# Patient Record
Sex: Male | Born: 1990 | Race: White | Hispanic: No | Marital: Single | Smoking: Current every day smoker
Health system: Southern US, Community
[De-identification: ages and names within clinical notes are randomized; demographics above are authoritative.]

## PROBLEM LIST (undated history)

## (undated) DIAGNOSIS — M461 Sacroiliitis, not elsewhere classified: Secondary | ICD-10-CM

## (undated) DIAGNOSIS — M47817 Spondylosis without myelopathy or radiculopathy, lumbosacral region: Secondary | ICD-10-CM

## (undated) DIAGNOSIS — M5136 Other intervertebral disc degeneration, lumbar region: Secondary | ICD-10-CM

## (undated) DIAGNOSIS — G894 Chronic pain syndrome: Secondary | ICD-10-CM

## (undated) HISTORY — DX: Chronic pain syndrome: G89.4

## (undated) HISTORY — DX: Other intervertebral disc degeneration, lumbar region: M51.36

## (undated) HISTORY — DX: Spondylosis without myelopathy or radiculopathy, lumbosacral region: M47.817

## (undated) HISTORY — DX: Sacroiliitis, not elsewhere classified: M46.1

---

## 2016-01-30 DIAGNOSIS — J40 Bronchitis, not specified as acute or chronic: Secondary | ICD-10-CM | POA: Diagnosis not present

## 2016-01-30 DIAGNOSIS — J0100 Acute maxillary sinusitis, unspecified: Secondary | ICD-10-CM | POA: Diagnosis not present

## 2016-01-30 DIAGNOSIS — J029 Acute pharyngitis, unspecified: Secondary | ICD-10-CM | POA: Diagnosis not present

## 2016-01-30 DIAGNOSIS — Z6823 Body mass index (BMI) 23.0-23.9, adult: Secondary | ICD-10-CM | POA: Diagnosis not present

## 2016-05-08 DIAGNOSIS — J069 Acute upper respiratory infection, unspecified: Secondary | ICD-10-CM | POA: Diagnosis not present

## 2016-05-08 DIAGNOSIS — Z6823 Body mass index (BMI) 23.0-23.9, adult: Secondary | ICD-10-CM | POA: Diagnosis not present

## 2016-05-08 DIAGNOSIS — R0981 Nasal congestion: Secondary | ICD-10-CM | POA: Diagnosis not present

## 2016-10-16 DIAGNOSIS — R109 Unspecified abdominal pain: Secondary | ICD-10-CM | POA: Diagnosis not present

## 2016-10-16 DIAGNOSIS — Z6823 Body mass index (BMI) 23.0-23.9, adult: Secondary | ICD-10-CM | POA: Diagnosis not present

## 2016-10-16 DIAGNOSIS — R509 Fever, unspecified: Secondary | ICD-10-CM | POA: Diagnosis not present

## 2016-10-18 DIAGNOSIS — M545 Low back pain: Secondary | ICD-10-CM | POA: Diagnosis not present

## 2016-10-18 DIAGNOSIS — Z87891 Personal history of nicotine dependence: Secondary | ICD-10-CM | POA: Diagnosis not present

## 2016-10-18 DIAGNOSIS — R1031 Right lower quadrant pain: Secondary | ICD-10-CM | POA: Diagnosis not present

## 2016-10-18 DIAGNOSIS — R109 Unspecified abdominal pain: Secondary | ICD-10-CM | POA: Diagnosis not present

## 2017-03-31 ENCOUNTER — Other Ambulatory Visit: Payer: Self-pay

## 2017-03-31 ENCOUNTER — Emergency Department (INDEPENDENT_AMBULATORY_CARE_PROVIDER_SITE_OTHER)
Admission: EM | Admit: 2017-03-31 | Discharge: 2017-03-31 | Disposition: A | Discharge status: Home / Self Care | Payer: BLUE CROSS/BLUE SHIELD | Source: Home / Self Care | Attending: Family Medicine | Admitting: Family Medicine

## 2017-03-31 DIAGNOSIS — M545 Low back pain, unspecified: Secondary | ICD-10-CM

## 2017-03-31 MED ORDER — CYCLOBENZAPRINE HCL 10 MG PO TABS: 10.0000 mg | ORAL_TABLET | Freq: Two times a day (BID) | ORAL | 0 refills | Status: DC | PRN

## 2017-03-31 MED ORDER — KETOROLAC TROMETHAMINE 60 MG/2ML IM SOLN
60.0000 mg | Freq: Once | INTRAMUSCULAR | Status: AC
  Administered 2017-03-31: 60 mg via INTRAMUSCULAR

## 2017-03-31 MED ORDER — MELOXICAM 15 MG PO TABS: 15.0000 mg | ORAL_TABLET | Freq: Every day | ORAL | 0 refills | Status: DC

## 2017-03-31 MED ORDER — TRAMADOL HCL 50 MG PO TABS: 50.0000 mg | ORAL_TABLET | Freq: Four times a day (QID) | ORAL | 0 refills | Status: DC | PRN

## 2017-03-31 MED ORDER — METHYLPREDNISOLONE SODIUM SUCC 40 MG IJ SOLR
80.0000 mg | Freq: Once | INTRAMUSCULAR | Status: AC
  Administered 2017-03-31: 80 mg via INTRAMUSCULAR

## 2017-03-31 MED ORDER — PREDNISONE 20 MG PO TABS: ORAL_TABLET | ORAL | 0 refills | Status: DC

## 2017-03-31 NOTE — ED Triage Notes (Signed)
Pt stated that his back started hurting at work on Friday.  Since then, has had severe back pain on the lower right side.  He states that it has been constant through the weekend, and today.  Takes time to stand, and start moving.  Denies numbness and tingling.  Denies injury.

## 2017-03-31 NOTE — ED Provider Notes (Signed)
[facility]    CSN: [id number] Arrival date & time: 03/31/17  1421     History   Chief Complaint Chief Complaint  Patient presents with  . Back Pain    LOWER RIGHT    HPI Ramond Craverhomas Fischetti is a 26 y.o. male.   HPI  Ramond Craverhomas Estock is a 26 y.o. male presenting to UC with c/o 3 days of gradually worsening Right lower back pain that started while he was at work. He does a lot of bending and lifting at work but does not recall a specific injury.  Pain is aching and sharp at times, 9/10, worse with certain movements.  He has taken ibuprofen and tried OTC Icy Hot w/o relief.  Denies radiation of pain or numbness into his arms or legs. No prior back or neck problems. Denies urinary symptoms. No rashes.    History reviewed. No pertinent past medical history.  There are no active problems to display for this patient.   History reviewed. No pertinent surgical history.     Home Medications    Prior to Admission medications   Medication Sig Start Date End Date Taking? Authorizing Provider  cyclobenzaprine (FLEXERIL) 10 MG tablet Take 1 tablet (10 mg total) 2 (two) times daily as needed by mouth for muscle spasms. 03/31/17   Lurene ShadowPhelps, Seleste Tallman O, PA-C  meloxicam (MOBIC) 15 MG tablet Take 1 tablet (15 mg total) daily by mouth. For at least 5 days, then daily as needed for pain 03/31/17   Lurene ShadowPhelps, Brittanyann Wittner O, PA-C  traMADol (ULTRAM) 50 MG tablet Take 1 tablet (50 mg total) every 6 (six) hours as needed by mouth for moderate pain or severe pain. 03/31/17   Lurene ShadowPhelps, Florencia Zaccaro O, PA-C    Family History History reviewed. No pertinent family history.  Social History Social History   Tobacco Use  . Smoking status: Current Every Day Smoker  . Smokeless tobacco: Current User  Substance Use Topics  . Alcohol use: Yes  . Drug use: No     Allergies   Patient has no known allergies.   Review of Systems Review of Systems  Constitutional: Negative for chills and fever.  Genitourinary:  Negative for dysuria, flank pain, frequency and hematuria.  Musculoskeletal: Positive for back pain and myalgias. Negative for arthralgias, gait problem, joint swelling, neck pain and neck stiffness.  Skin: Negative for color change and rash.  Neurological: Negative for weakness and numbness.     Physical Exam Triage Vital Signs ED Triage Vitals  Enc Vitals Group     BP 03/31/17 1442 139/79     Pulse Rate 03/31/17 1442 68     Resp --      Temp 03/31/17 1442 98.4 F (36.9 C)     Temp Source 03/31/17 1442 Oral     SpO2 03/31/17 1442 98 %     Weight 03/31/17 1443 178 lb (80.7 kg)     Height 03/31/17 1443 6\' 2"  (1.88 m)     Head Circumference --      Peak Flow --      Pain Score 03/31/17 1443 9     Pain Loc --      Pain Edu? --      Excl. in GC? --    No data found.  Updated Vital Signs BP 139/79 (BP Location: Right Arm)   Pulse 68   Temp 98.4 F (36.9 C) (Oral)   Ht 6\' 2"  (1.88 m)   Wt 178 lb (80.7 kg)  SpO2 98%   BMI 22.85 kg/m   Visual Acuity Right Eye Distance:   Left Eye Distance:   Bilateral Distance:    Right Eye Near:   Left Eye Near:    Bilateral Near:     Physical Exam  Constitutional: He is oriented to person, place, and time. He appears well-developed and well-nourished. No distress.  HENT:  Head: Normocephalic and atraumatic.  Mouth/Throat: Oropharynx is clear and moist.  Eyes: EOM are normal.  Neck: Normal range of motion.  Cardiovascular: Normal rate.  Pulmonary/Chest: Effort normal. No respiratory distress.  Musculoskeletal: Normal range of motion. He exhibits tenderness. He exhibits no edema.  Mild tenderness along spin from mid thoracic to lower lumbar spine. No step-offs or crepitus. Tenderness to Right sided thoracic and lumbar muscles.  Increased pain with straight leg raise on the Right. No hip tenderness or crepitus Normal gait.  Neurological: He is alert and oriented to person, place, and time.  Skin: Skin is warm and dry. Capillary  refill takes less than 2 seconds. No rash noted. He is not diaphoretic. No erythema.  Psychiatric: He has a normal mood and affect. His behavior is normal.  Nursing note and vitals reviewed.    UC Treatments / Results  Labs (all labs ordered are listed, but only abnormal results are displayed) Labs Reviewed - No data to display  EKG  EKG Interpretation None       Radiology No results found.  Procedures Procedures (including critical care time)  Medications Ordered in UC Medications  ketorolac (TORADOL) injection 60 mg (60 mg Intramuscular Given 03/31/17 1509)  methylPREDNISolone sodium succinate (SOLU-MEDROL) 40 mg/mL injection 80 mg (80 mg Intramuscular Given 03/31/17 1508)     Initial Impression / Assessment and Plan / UC Course  I have reviewed the triage vital signs and the nursing notes.  Pertinent labs & imaging results that were available during my care of the patient were reviewed by me and considered in my medical decision making (see chart for details).     Pt c/o Right side low back pain after bending and lifting at work 3 days ago. No red flag symptoms Pain likely due to muscle strain Will hold off on plain films at this time as no specific known injury Encouraged light duty and rest Pt given Toradol 60mg  IM and Solumedrol 80mg  IM in UC May try medications prescribed today Encouraged f/u with Sports Medicine in 1 week if not improving.   Final Clinical Impressions(s) / UC Diagnoses   Final diagnoses:  Acute right-sided low back pain without sciatica    ED Discharge Orders        Ordered    traMADol (ULTRAM) 50 MG tablet  Every 6 hours PRN     03/31/17 1459    cyclobenzaprine (FLEXERIL) 10 MG tablet  2 times daily PRN     03/31/17 1459    meloxicam (MOBIC) 15 MG tablet  Daily     03/31/17 1459       Controlled Substance Prescriptions [place] Controlled Substance Registry consulted? Not Applicable   Rolla Platehelps, Robin Pafford O, PA-C 03/31/17 1544

## 2017-03-31 NOTE — Discharge Instructions (Signed)
°  Tramadol is strong pain medication. While taking, do not drink alcohol, drive, or perform any other activities that requires focus while taking these medications.   Meloxicam (Mobic) is an antiinflammatory to help with pain and inflammation.  Do not take ibuprofen, Advil, Aleve, or any other medications that contain NSAIDs while taking meloxicam as this may cause stomach upset or even ulcers if taken in large amounts for an extended period of time.   You were given a shot of solumedrol (a steroid) today to help with inflammation in your lungs to help you breath better and to help with your cough.  You have been prescribed 5 days of prednisone, an oral steroid.  You may start this medication tomorrow with breakfast.

## 2017-04-07 ENCOUNTER — Encounter: Payer: Self-pay | Admitting: Family Medicine

## 2017-04-07 ENCOUNTER — Ambulatory Visit (INDEPENDENT_AMBULATORY_CARE_PROVIDER_SITE_OTHER): Payer: BLUE CROSS/BLUE SHIELD

## 2017-04-07 ENCOUNTER — Ambulatory Visit: Payer: BLUE CROSS/BLUE SHIELD | Admitting: Family Medicine

## 2017-04-07 VITALS — BP 122/49 | HR 71 | Temp 97.5°F | Resp 16 | Ht 74.0 in | Wt 182.0 lb

## 2017-04-07 DIAGNOSIS — M545 Low back pain: Secondary | ICD-10-CM | POA: Diagnosis not present

## 2017-04-07 DIAGNOSIS — S39012A Strain of muscle, fascia and tendon of lower back, initial encounter: Secondary | ICD-10-CM

## 2017-04-07 DIAGNOSIS — R200 Anesthesia of skin: Secondary | ICD-10-CM | POA: Diagnosis not present

## 2017-04-07 MED ORDER — METHOCARBAMOL 500 MG PO TABS: 500.0000 mg | ORAL_TABLET | Freq: Three times a day (TID) | ORAL | 0 refills | Status: DC

## 2017-04-07 NOTE — Patient Instructions (Addendum)
Thank you for coming in today. Get xray today.  Attend PT.  Use a tens unit.  Try methocarbamol for muscle spasm.   Recheck with me in 2 weeks if still hurting.   TENS UNIT: This is helpful for muscle pain and spasm.   Search and Purchase a TENS 7000 2nd edition at  www.tenspros.com or www.Amazon.com It should be less than $30.     TENS unit instructions: Do not shower or bathe with the unit on Turn the unit off before removing electrodes or batteries If the electrodes lose stickiness add a drop of water to the electrodes after they are disconnected from the unit and place on plastic sheet. If you continued to have difficulty, call the TENS unit company to purchase more electrodes. Do not apply lotion on the skin area prior to use. Make sure the skin is clean and dry as this will help prolong the life of the electrodes. After use, always check skin for unusual red areas, rash or other skin difficulties. If there are any skin problems, does not apply electrodes to the same area. Never remove the electrodes from the unit by pulling the wires. Do not use the TENS unit or electrodes other than as directed. Do not change electrode placement without consultating your therapist or physician. Keep 2 fingers with between each electrode. Wear time ratio is 2:1, on to off times.    For example on for 30 minutes off for 15 minutes and then on for 30 minutes off for 15 minutes     Lumbosacral Strain Lumbosacral strain is an injury that causes pain in the lower back (lumbosacral spine). This injury usually occurs from overstretching the muscles or ligaments along your spine. A strain can affect one or more muscles or cord-like tissues that connect bones to other bones (ligaments). What are the causes? This condition may be caused by:  A hard, direct hit (blow) to the back.  Excessive stretching of the lower back muscles. This may result from: ? A fall. ? Lifting something  heavy. ? Repetitive movements such as bending or crouching.  What increases the risk? The following factors may increase your risk of getting this condition:  Participating in sports or activities that involve: ? A sudden twist of the back. ? Pushing or pulling motions.  Being overweight or obese.  Having poor strength and flexibility, especially tight hamstrings or weak muscles in the back or abdomen.  Having too much of a curve in the lower back.  Having a pelvis that is tilted forward.  What are the signs or symptoms? The main symptom of this condition is pain in the lower back, at the site of the strain. Pain may extend (radiate) down one or both legs. How is this diagnosed? This condition is diagnosed based on:  Your symptoms.  Your medical history.  A physical exam. ? Your health care provider may push on certain areas of your back to determine the source of your pain. ? You may be asked to bend forward, backward, and side to side to assess the severity of your pain and your range of motion.  Imaging tests, such as: ? X-rays. ? MRI.  How is this treated? Treatment for this condition may include:  Putting heat and cold on the affected area.  Medicines to help relieve pain and relax your muscles (muscle relaxants).  NSAIDs to help reduce swelling and discomfort.  When your symptoms improve, it is important to gradually return to your normal  routine as soon as possible to reduce pain, avoid stiffness, and avoid loss of muscle strength. Generally, symptoms should improve within 6 weeks of treatment. However, recovery time varies. Follow these instructions at home: Managing pain, stiffness, and swelling   If directed, put ice on the injured area during the first 24 hours after your strain. ? Put ice in a plastic bag. ? Place a towel between your skin and the bag. ? Leave the ice on for 20 minutes, 2-3 times a day.  If directed, put heat on the affected area as  often as told by your health care provider. Use the heat source that your health care provider recommends, such as a moist heat pack or a heating pad. ? Place a towel between your skin and the heat source. ? Leave the heat on for 20-30 minutes. ? Remove the heat if your skin turns bright red. This is especially important if you are unable to feel pain, heat, or cold. You may have a greater risk of getting burned. Activity  Rest and return to your normal activities as told by your health care provider. Ask your health care provider what activities are safe for you.  Avoid activities that take a lot of energy for as long as told by your health care provider. General instructions  Take over-the-counter and prescription medicines only as told by your health care provider.  Donot drive or use heavy machinery while taking prescription pain medicine.  Do not use any products that contain nicotine or tobacco, such as cigarettes and e-cigarettes. If you need help quitting, ask your health care provider.  Keep all follow-up visits as told by your health care provider. This is important. How is this prevented?  Use correct form when playing sports and lifting heavy objects.  Use good posture when sitting and standing.  Maintain a healthy weight.  Sleep on a mattress with medium firmness to support your back.  Be safe and responsible while being active to avoid falls.  Do at least 150 minutes of moderate-intensity exercise each week, such as brisk walking or water aerobics. Try a form of exercise that takes stress off your back, such as swimming or stationary cycling.  Maintain physical fitness, including: ? Strength. ? Flexibility. ? Cardiovascular fitness. ? Endurance. Contact a health care provider if:  Your back pain does not improve after 6 weeks of treatment.  Your symptoms get worse. Get help right away if:  Your back pain is severe.  You cannot stand or walk.  You have  difficulty controlling when you urinate or when you have a bowel movement.  You feel nauseous or you vomit.  Your feet get very cold.  You have numbness, tingling, weakness, or problems using your arms or legs.  You develop any of the following: ? Shortness of breath. ? Dizziness. ? Pain in your legs. ? Weakness in your buttocks or legs. ? Discoloration of the skin on your toes or legs. This information is not intended to replace advice given to you by your health care provider. Make sure you discuss any questions you have with your health care provider. Document Released: 02/13/2005 Document Revised: 11/24/2015 Document Reviewed: 10/08/2015 Elsevier Interactive Patient Education  2017 [employer]

## 2017-04-07 NOTE — Progress Notes (Signed)
Subjective:    I'm seeing this patient as a consultation for:    Scott Greene, Scott Greene, Scott Greene  CC: Back pain  HPI: Scott Greene has acute low back pain without radiation over a week.  The pain is quite severe and is located on the right lower.  The pain is interfering with his ability to work.  He has been out of work now for about a week.  He was originally seen in occupational health but his Worker's Compensation claim was denied.  He was seen in urgent care on November 12 where he was provided with meloxicam prednisone tramadol and Flexeril all of which have not been very helpful.  He denies any bowel bladder dysfunction weakness or numbness.  Denies pain radiating down his leg.  The pain is worse with activity and better with rest.  The pain is quite severe at times.  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  Denies any specific injury.  Past medical history, Surgical history, Family history not pertinant except as noted below, Social history, Allergies, and medications have been entered into the medical record, reviewed, and no changes needed.   Review of Systems: No headache, visual changes, nausea, vomiting, diarrhea, constipation, dizziness, abdominal pain, skin rash, fevers, chills, night sweats, weight loss, swollen lymph nodes, body aches, joint swelling, muscle aches, chest pain, shortness of breath, mood changes, visual or auditory hallucinations.   Objective:    Vitals:   04/07/17 1124  BP: (!) 122/49  Pulse: 71  Resp: 16  Temp: (!) 97.5 F (36.4 C)  SpO2: 100%   General: Well Developed, well nourished, and in no acute distress.  Neuro/Psych: Alert and oriented x3, extra-ocular muscles intact, able to move all 4 extremities, sensation grossly intact. Skin: Warm and dry, no rashes noted.  Respiratory: Not using accessory muscles, speaking in full sentences, trachea midline.  Cardiovascular: Pulses palpable, no extremity edema. Abdomen: Does not appear distended. MSK:    L-spine: Nontender to spinal midline. Patient right lower lumbar paraspinal muscle group and into the right lower sacrum. Range of motion is limited in all directions but is worse with flexion. Lower extremity strength is equal and normal throughout. Reflexes are equal and normal throughout. Sensation is intact throughout. Slump test is negative bilaterally  X-ray L-spine pending: Awaiting formal radiology review.  No results found for this or any previous visit (from the past 24 hour(s)). No results found.  Impression and Recommendations:    Assessment and Plan: [name] with lumbosacral strain quite severe and debilitating currently.  Plan to refer to physical therapy obtain an x-ray of the lumbar spine and continue treatment with NSAIDs.  Will use methocarbamol as a different muscle relaxer in hopes that it worked better than Flexeril.  Will treat primarily with physical therapy however we will also use a heating pad and a TENS unit as well.  Work note provided.  Recheck in 2 weeks.  Return sooner if needed..   Orders Placed This Encounter  Procedures  . DG Lumbar Spine Complete    Standing Status:   Future    Number of Occurrences:   1    Standing Expiration Date:   06/07/2018    Order Specific Question:   Reason for Exam (SYMPTOM  OR DIAGNOSIS REQUIRED)    Answer:   eval lumbosacral pain    Order Specific Question:   [facility]?    [facility]    Order Specific Question:  Radiology Contrast Protocol - do NOT remove file path    Answer:   [email or web address]  . Ambulatory referral to Physical Therapy    Referral Priority:   Routine    Referral Type:   Physical Medicine    Referral Reason:   Specialty Services Required    Requested Specialty:   Physical Therapy   Meds ordered this encounter  Medications  . methocarbamol (ROBAXIN) 500 MG tablet    Sig: Take 1 tablet (500 mg total) 3  (three) times daily by mouth.    Dispense:  90 tablet    Refill:  0    Discussed warning signs or symptoms. Please see discharge instructions. Patient expresses understanding.

## 2017-04-08 ENCOUNTER — Telehealth: Payer: Self-pay | Admitting: Emergency Medicine

## 2017-04-08 NOTE — Telephone Encounter (Signed)
Corrected work note. Will fill out paperwork when ready.

## 2017-04-08 NOTE — Telephone Encounter (Signed)
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 pk

## 2017-04-09 ENCOUNTER — Telehealth: Payer: Self-pay

## 2017-04-09 MED ORDER — HYDROCODONE-ACETAMINOPHEN 5-325 MG PO TABS: 1.0000 | ORAL_TABLET | Freq: Four times a day (QID) | ORAL | 0 refills | Status: DC | PRN

## 2017-04-09 NOTE — Telephone Encounter (Signed)
I spoke with Scott Greene.  I sent in Norco.  Use naproxen instead of meloxicam.  Recheck as scheduled  Continue PT.   Patient researched [facility] Controlled Substance Reporting System.

## 2017-04-09 NOTE — Telephone Encounter (Signed)
Spoke with patient about X-rays, patient states he has completed his pain medication and is still in pain. Advise patient try Tylenol or Ibuprofen for pain. Patient states he feels some type of way when taking the Tramadol and wants to know if a different Rx could be prescribed. Advise patient if something is to be changed it will have to be picked up today or it will have to be Monday morning 04/14/2017

## 2017-04-14 ENCOUNTER — Telehealth: Payer: Self-pay

## 2017-04-14 ENCOUNTER — Encounter: Payer: Self-pay | Admitting: Physical Therapy

## 2017-04-14 ENCOUNTER — Ambulatory Visit: Payer: BLUE CROSS/BLUE SHIELD | Admitting: Physical Therapy

## 2017-04-14 DIAGNOSIS — M25652 Stiffness of left hip, not elsewhere classified: Secondary | ICD-10-CM | POA: Diagnosis not present

## 2017-04-14 DIAGNOSIS — M25651 Stiffness of right hip, not elsewhere classified: Secondary | ICD-10-CM

## 2017-04-14 DIAGNOSIS — M62830 Muscle spasm of back: Secondary | ICD-10-CM | POA: Diagnosis not present

## 2017-04-14 DIAGNOSIS — M545 Low back pain, unspecified: Secondary | ICD-10-CM

## 2017-04-14 MED ORDER — HYDROCODONE-ACETAMINOPHEN 5-325 MG PO TABS: 1.0000 | ORAL_TABLET | Freq: Four times a day (QID) | ORAL | 0 refills | Status: DC | PRN

## 2017-04-14 NOTE — Telephone Encounter (Signed)
Refilled Please inform pt.  Return to clinic for further fills.

## 2017-04-14 NOTE — Telephone Encounter (Signed)
Pt called triage regarding refill. Will route.

## 2017-04-14 NOTE — Patient Instructions (Addendum)
Trunk: Prone Extension (Press-Ups)    Lie on stomach on firm, flat surface. Relax bottom and legs. Raise chest in air with elbows straight. Keep hips flat on surface, sag stomach. Hold __2-3__ seconds. Repeat __5-10__ times. Do __3-4__ sessions per day. CAUTION: Movement should be gentle and slow.   On Elbows (Prone)    Rise up on elbows as high as possible, keeping hips on floor. Hold _20___ seconds. Repeat _1___ times per set. Do __1__ sets per session. Do _3-4___ sessions per day.  Supine: Leg Stretch with Strap (Super Advanced)    Lie on back with one leg straight. Hook strap around other foot. Straighten knee. Raise leg to maximal stretch and straighten knee further by tightening quadriceps. Slowly press other leg down as close to floor as possible. Keep lower abdominals tight. Hold 45-60_ seconds. Warning: Intense stretch. Stay within tolerance. Repeat __2_ times per session. Do _1__ sessions per day.  Hamstring: Wall Stretch    Lie on floor with right leg on wall, other leg through doorway. Scoot buttocks toward wall until stretch is felt in back of thigh. Hold __atleast 45-60_ seconds or longer. Relax. Repeat __1_ times. Do ___ times a day. Repeat with other leg.  Trigger Point Dry Needling  . What is Trigger Point Dry Needling (DN)? o DN is a physical therapy technique used to treat muscle pain and dysfunction. Specifically, DN helps deactivate muscle trigger points (muscle knots).  o A thin filiform needle is used to penetrate the skin and stimulate the underlying trigger point. The goal is for a local twitch response (LTR) to occur and for the trigger point to relax. No medication of any kind is injected during the procedure.   . What Does Trigger Point Dry Needling Feel Like?  o The procedure feels different for each individual patient. Some patients report that they do not actually feel the needle enter the skin and overall the process is not painful. Very mild  bleeding may occur. However, many patients feel a deep cramping in the muscle in which the needle was inserted. This is the local twitch response.   Marland Kitchen. How Will I feel after the treatment? o Soreness is normal, and the onset of soreness may not occur for a few hours. Typically this soreness does not last longer than two days.  o Bruising is uncommon, however; ice can be used to decrease any possible bruising.  o In rare cases feeling tired or nauseous after the treatment is normal. In addition, your symptoms may get worse before they get better, this period will typically not last longer than 24 hours.   . What Can I do After My Treatment? o Increase your hydration by drinking more water for the next 24 hours. o You may place ice or heat on the areas treated that have become sore, however, do not use heat on inflamed or bruised areas. Heat often brings more relief post needling. o You can continue your regular activities, but vigorous activity is not recommended initially after the treatment for 24 hours. o DN is best combined with other physical therapy such as strengthening, stretching, and other therapies.

## 2017-04-14 NOTE — Telephone Encounter (Signed)
Patient called stated that he went to physical therapy and they made him more sore. He is requesting a refill on Hydrocodone. Please advise patient said he is down to his last pill. Teresina Bugaj,CMA

## 2017-04-14 NOTE — Therapy (Signed)
[facility] [address] [place], [place], [address] Phone: [phone]   Fax:  [phone]  Physical Therapy Evaluation  Patient Details  Name: Scott Greene MRN: [id number] Date of Birth: August 13, 1990 Referring Provider: Dr Teressa LowerE Corey   Encounter Date: 04/14/2017  PT End of Session - 04/14/17 0843    Visit Number  1    Number of Visits  8    Date for PT Re-Evaluation  05/12/17    PT Start Time  0843    PT Stop Time  0956    PT Time Calculation (min)  73 min    Activity Tolerance  Patient tolerated treatment well       History reviewed. No pertinent past medical history.  History reviewed. No pertinent surgical history.  There were no vitals filed for this visit.   Subjective Assessment - 04/14/17 0845    Subjective  Pt reports at the  end of his work day he noticed Rt sided back pain about 2-3 weeks. He tried heat/ice and OTC meds through the weekend without relief.  Went to urgent care, had some medications with limited relief.  He has continued pain in the low back Rt side, occassionally into the Rt leg and up toward his thoracic area.  Currently out of work.     How long can you sit comfortably?  ok in a recliner, in straight back chair has pain    How long can you walk comfortably?  increases back pain with distance    Diagnostic tests  x-rays (-)     Patient Stated Goals  get rid of back pain.     Currently in Pain?  Yes    Pain Score  7     Pain Location  Back    Pain Orientation  Right;Lower;Mid    Pain Descriptors / Indicators  Stabbing;Sore    Pain Type  Acute pain    Pain Onset  1 to 4 weeks ago    Pain Frequency  Constant    Aggravating Factors   bending in all directions, prolonged sitting and walking    Pain Relieving Factors  lying flat on back.          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  Has to get into unusual positions.     Leisure  golf , was working out Reliant Energylifting weights and cardio a couple times a week.       Observation/Other Assessments   Focus on Therapeutic Outcomes (FOTO)   60% limited      Posture/Postural Control   Posture/Postural Control  Postural limitations    Postural Limitations  Decreased lumbar lordosis;Decreased thoracic kyphosis limited spinal curves      ROM / Strength   AROM / PROM / Strength  AROM;Strength      AROM   Overall AROM Comments  pain with all lumbar motions.     AROM Assessment Site  Lumbar    Lumbar Flexion  10% present    Lumbar Extension  25% present    Lumbar - Right Side Bend  75% present    Lumbar - Left Side Bend  50% present    Lumbar - Right Rotation  50% present    Lumbar - Left Rotation  75% present      Strength   Strength Assessment Site  Hip;Knee;Ankle;Lumbar    Right/Left Hip  Right Lt wNL    Right Hip Flexion  5/5    Right Hip Extension  5/5    Right Hip ABduction  4+/5 with pain    Right/Left Knee  -- WNL except hamstring 4+/5    Right/Left Ankle  -- WNL    Lumbar Flexion  -- TA good    Lumbar Extension  -- multifidi good.       Flexibility   Soft Tissue Assessment /Muscle Length  yes    Hamstrings  supine SLR Lt 42, Rt 45    Piriformis  tight bilat      Palpation   Spinal mobility  good in sacrum, lumbar and lower thoracic.     Palpation comment  very tight and tender in Rt paraspinals L5-T 8 and into Rt QL      Special Tests    Special Tests  Lumbar    Lumbar Tests  Slump Test;Straight Leg Raise      Slump  test   Findings  Negative    Side  -- bilat      Straight Leg Raise   Findings  Negative    Side   -- bilat             Objective measurements completed on examination: See above findings.      [facility] Adult PT Treatment/Exercise - 04/14/17 0001      Exercises   Exercises  Lumbar      Lumbar Exercises: Stretches   Active Hamstring Stretch  2 reps;60 seconds with strap each side.     Single Knee to Chest Stretch  2 reps;20 seconds each side    Lower Trunk Rotation  -- 10 reps    Quadruped Mid Back Stretch Limitations  attempted cat/cow, pt with no pelvic motion Prone on elbows and prone press ups.       Modalities   Modalities  Electrical Stimulation;Moist Heat      Moist Heat Therapy   Number Minutes Moist Heat  20 Minutes    Moist Heat Location  Lumbar Spine      Electrical Stimulation   Electrical Stimulation Location  Rt low back    Electrical Stimulation Action  IFC    Electrical Stimulation Parameters  to tolerance    Electrical Stimulation Goals  Tone;Pain      Manual Therapy   Manual Therapy  Soft tissue mobilization;Joint mobilization    Joint Mobilization  grade II CPA and Rt UPA lumbar mobs    Soft tissue mobilization  Rt low back - STM to lumbar paraspinals       Trigger Point Dry Needling - 04/14/17 [id number]    Consent Given?  Yes    Education Handout Provided  Yes    Muscles Treated Upper Body  Longissimus    Longissimus Response  Palpable increased muscle length;Twitch response elicited Rt lumbar L4-1  PT Education - 04/14/17 0935    Education provided  Yes    Education Details  HEP and dry needling    Person(s) Educated  Patient    Methods  Explanation;Demonstration;Handout    Comprehension  Returned demonstration;Verbalized understanding          PT Long Term Goals - 04/14/17 0948      PT LONG TERM GOAL #1   Title  I with advanced HEP ( 05/12/17)    Time  4    Period  Weeks    Status  New      PT LONG TERM GOAL #2    Title  report no more than 1/10 Rt sided low back pain after a work shift ( 05/12/17)     Time  4    Period  Weeks    Status  New      PT LONG TERM GOAL #3   Title  demo full and painfree lumbar ROM ( 05/12/17)     Time  4    Period  Weeks    Status  New      PT LONG TERM GOAL #4   Title  improve bilat hamstring flexibility =/> 65 degrees ( 05/12/17)     Time  4    Period  Weeks    Status  New      PT LONG TERM GOAL #5   Title  improve FOTO =/< 34% limited ( 05/12/17)     Time  4    Period  Weeks    Status  New             Plan - 04/14/17 1610    Clinical Impression Statement  Pt presents with about 2 wk h/o Rt sided low back pain.  He has a lot of muscular tightness inthis area. He has decreased spinal curves, very tight hamstrings and minimal to no pelvic motion.  Overall his strength is good.  He is currently out of work.  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     Clinical Presentation  Unstable    Clinical Decision Making  Low    Rehab Potential  Good    PT Frequency  2x / week    PT Duration  4 weeks    PT Treatment/Interventions  Dry needling;Manual techniques;Moist Heat;Ultrasound;Therapeutic activities;Patient/family education;Therapeutic exercise;Cryotherapy;Electrical Stimulation;Passive range of motion    PT Next Visit Plan  manual work rt low back, work on getting pelvic mobility and increasing hamstring flexibility.     Consulted and Agree with Plan of Care  Patient       Patient will benefit from skilled therapeutic intervention in order to improve the following deficits and impairments:  Improper body mechanics, Pain, Postural dysfunction, Increased muscle spasms, Hypomobility, Impaired flexibility  Visit Diagnosis: Acute right-sided low back pain without sciatica - Plan: PT plan of care cert/re-cert  Stiffness of right hip, not elsewhere classified - Plan: PT plan of care cert/re-cert  Stiffness of left  hip, not elsewhere classified - Plan: PT plan of care cert/re-cert  Muscle spasm of back - Plan: PT plan of care cert/re-cert     Problem List There are no active problems to display for this patient.   Roderic Scarce PT  04/14/2017, 9:52 AM  [facility] [address] [place], [place], [address] Phone: [phone]   Fax:  [phone]  Name: Scott Greene MRN: [id number]  Date of Birth: [birth year]

## 2017-04-17 ENCOUNTER — Telehealth: Payer: Self-pay | Admitting: Family Medicine

## 2017-04-17 ENCOUNTER — Encounter: Payer: Self-pay | Admitting: Physical Therapy

## 2017-04-17 ENCOUNTER — Ambulatory Visit: Payer: BLUE CROSS/BLUE SHIELD | Admitting: Physical Therapy

## 2017-04-17 DIAGNOSIS — M25651 Stiffness of right hip, not elsewhere classified: Secondary | ICD-10-CM | POA: Diagnosis not present

## 2017-04-17 DIAGNOSIS — M545 Low back pain, unspecified: Secondary | ICD-10-CM

## 2017-04-17 NOTE — Telephone Encounter (Signed)
Spoke to patient advised him that copy has been faxed. Kathyrn Warmuth,CMA

## 2017-04-17 NOTE — Patient Instructions (Addendum)
Trunk Extension    Standing, place back of open hands on low back. Straighten spine then arch the back and move shoulders back. Pause for 2-5 seconds.  Repeat __2-3__ times per session. Do __several__ sessions per day.  AVOID THE RECLINER!   Abdominal Bracing With Pelvic Floor (Hook-Lying)   With neutral spine, tighten pelvic floor and abdominals. Hold 10 seconds. Repeat __10_ times. Do _several__ times a day. Can be performed in sitting, standing. .  Knee to Chest: Transverse Plane Stability   Bring one knee up, then return. Be sure pelvis does not roll side to side. Keep pelvis still. Lift knee __10_ times each leg. Restabilize pelvis. Repeat with other leg. Do _1-2__ sets.   Hip External Rotation With Pillow: Transverse Plane Stability   KEEP BOTH KNEES BENT.  Slowly roll bent knee out. Be sure pelvis does not rotate. Do _10__ times. Restabilize pelvis. Repeat with other leg. Do _1-2__ sets, _1__ times per day.   [facility] [address] [place], [place] [address]  [phone]) [phone] (fax)    Sleeping on Back  Place pillow under knees. A pillow with cervical support and a roll around waist are also helpful. Copyright  VHI. All rights reserved.  Sleeping on Side Place pillow between knees. Use cervical support under neck and a roll around waist as needed. Copyright  VHI. All rights reserved.   Sleeping on Stomach   If this is the only desirable sleeping position, place pillow under lower legs, and under stomach or chest as needed.  Posture - Sitting   Sit upright, head facing forward. Try using a roll to support lower back. Keep shoulders relaxed, and avoid rounded back. Keep hips level with knees. Avoid crossing legs for long periods. Stand to Sit / Sit to Stand   To sit: Bend knees to lower self onto front edge of chair, then scoot back on seat. To stand: Reverse sequence by placing one  foot forward, and scoot to front of seat. Use rocking motion to stand up.   Work Height and Reach  Ideal work height is no more than 2 to 4 inches below elbow level when standing, and at elbow level when sitting. Reaching should be limited to arm's length, with elbows slightly bent.  Bending  Bend at hips and knees, not back. Keep feet shoulder-width apart.    Posture - Standing   Good posture is important. Avoid slouching and forward head thrust. Maintain curve in low back and align ears over shoul- ders, hips over ankles.  Alternating Positions   Alternate tasks and change positions frequently to reduce fatigue and muscle tension. Take rest breaks. Computer Work   Position work to Art gallery managerface forward. Use proper work and seat height. Keep shoulders back and down, wrists straight, and elbows at right angles. Use chair that provides full back support. Add footrest and lumbar roll as needed.  Getting Into / Out of Car  Lower self onto seat, scoot back, then bring in one leg at a time. Reverse sequence to get out.  Dressing  Lie on back to pull socks or slacks over feet, or sit and bend leg while keeping back straight.    Housework - Sink  Place one foot on ledge of cabinet under sink when standing at sink for prolonged periods.   Pushing / Pulling  Pushing is preferable to pulling. Keep back in proper alignment, and use leg muscles to do the work.  Deep Squat   Squat and lift with both arms held against upper trunk. Tighten stomach muscles without holding breath. Use smooth movements to avoid jerking.  Avoid Twisting   Avoid twisting or bending back. Pivot around using foot movements, and bend at knees if needed when reaching for articles.  Carrying Luggage   Distribute weight evenly on both sides. Use a cart whenever possible. Do not twist trunk. Move body as a unit.   Lifting Principles .Maintain proper posture and head alignment. .Slide object as close as  possible before lifting. .Move obstacles out of the way. .Test before lifting; ask for help if too heavy. .Tighten stomach muscles without holding breath. .Use smooth movements; do not jerk. .Use legs to do the work, and pivot with feet. .Distribute the work load symmetrically and close to the center of trunk. .Push instead of pull whenever possible.   Ask For Help   Ask for help and delegate to others when possible. Coordinate your movements when lifting together, and maintain the low back curve.  Log Roll   Lying on back, bend left knee and place left arm across chest. Roll all in one movement to the right. Reverse to roll to the left. Always move as one unit. Housework - Sweeping  Use long-handled equipment to avoid stooping.   Housework - Wiping  Position yourself as close as possible to reach work surface. Avoid straining your back.  Laundry - Unloading Wash   To unload small items at bottom of washer, lift leg opposite to arm being used to reach.  Gardening - Raking  Move close to area to be raked. Use arm movements to do the work. Keep back straight and avoid twisting.     Cart  When reaching into cart with one arm, lift opposite leg to keep back straight.   Getting Into / Out of Bed  Lower self to lie down on one side by raising legs and lowering head at the same time. Use arms to assist moving without twisting. Bend both knees to roll onto back if desired. To sit up, start from lying on side, and use same move-ments in reverse. Housework - Vacuuming  Hold the vacuum with arm held at side. Step back and forth to move it, keeping head up. Avoid twisting.   Laundry - Armed forces training and education officerLoading Wash  Position laundry basket so that bending and twisting can be avoided.   Laundry - Unloading Dryer  Squat down to reach into clothes dryer or use a reacher.  Gardening - Weeding / Psychiatric nurselanting  Squat or Kneel. Knee pads may be helpful.                   TENS  UNIT  This is helpful for muscle pain and spasm.   Search and Purchase a TENS 7000 2nd edition at www.tenspros.com or www.amazon.com  (It should be less than $30)     TENS unit instructions:   Do not shower or bathe with the unit on  Turn the unit off before removing electrodes or batteries  If the electrodes lose stickiness add a drop of water to the electrodes after they are disconnected from the unit and place on plastic sheet. If you continued to have difficulty, call the TENS unit company to purchase more electrodes.  Do not apply lotion on the skin area prior to use. Make sure the skin is clean and dry as this will help prolong the life of the electrodes.  After use, always check  skin for unusual red areas, rash or other skin difficulties. If there are any skin problems, does not apply electrodes to the same area.  Never remove the electrodes from the unit by pulling the wires.  Do not use the TENS unit or electrodes other than as directed.  Do not change electrode placement without consulting your therapist or physician.  Keep 2 fingers with between each electrode.  [facility] at [facility] [address] [place], [place] [address]  [phone] (office) [phone] (fax) .

## 2017-04-17 NOTE — Telephone Encounter (Signed)
FMLA form filled out and will be faxed today

## 2017-04-17 NOTE — Therapy (Signed)
[facility] [place] [address] [place], [place], [address] Phone: [phone]   Fax:  [phone]  Physical Therapy Treatment  Patient Details  Name: Scott Greene MRN: [id number] Date of Birth: 06-14-1990 Referring Provider: Dr. Denyse Amassorey    Encounter Date: 04/17/2017  PT End of Session - 04/17/17 0934    Visit Number  2    Number of Visits  8    Date for PT Re-Evaluation  05/12/17    PT Start Time  0850    PT Stop Time  0948    PT Time Calculation (min)  58 min    Activity Tolerance  Patient limited by pain    Behavior During Therapy  [facility] for tasks assessed/performed       History reviewed. No pertinent past medical history.  History reviewed. No pertinent surgical history.  There were no vitals filed for this visit.  Subjective Assessment - 04/17/17 0852    Subjective  [name] reports he had a shooting pain down his Right leg the night of last treatment. He is taking muscle relaxer 1-2x/day, pain meds 2x/day.  He hasn't taken anything yet today.      Patient Stated Goals  get rid of back pain.     Currently in Pain?  Yes    Pain Score  8     Pain Location  Back    Pain Orientation  Right    Pain Descriptors / Indicators  Stabbing    Aggravating Factors   bending in all directions, prolonged sitting    Pain Relieving Factors  laying flat on back.          [facility] - 04/17/17 0001      Assessment   Medical Diagnosis  Lumbosacral strain    Referring Provider  Dr. Cherlynn Juneorey     Hand Dominance  Right    Next MD Visit  04/22/17       [facility]/Exercise - 04/17/17 0001      Self-Care   Self-Care  Posture;Heat/Ice Application;Other Self-Care Comments    Posture  pt encouraged to avoid C curve postures, including laying in recliner.  Pt issued posture and body mechanics handout.     Heat/Ice Application  reviewed Ice/heat parameters.     Other Self-Care Comments   pt instructed in sit to/frm  supine via log roll and about how to get in/out of car with back precautions.  Pt verbalized understanding.       Lumbar Exercises: Stretches   Passive Hamstring Stretch  2 reps;30 seconds supine with strap    Prone on Elbows Stretch  -- 8 reps, 15 sec    Press Ups  1 rep;10 seconds increased Rt low back pain    Piriformis Stretch  30 seconds;1 rep;Limitations fig 4 with knee towards opp shoulder    Piriformis Stretch Limitations  increased Rt back pain      Lumbar Exercises: Aerobic   Stationary Bike  unable to tolerate; NuStep L5 slow pace x 4 min       Lumbar Exercises: Supine   Ab Set  10 reps 10 sec     Clam  10 reps with ab set    Bent Knee Raise  10 reps with ab set      Modalities   Modalities  Electrical Stimulation;Cryotherapy      Cryotherapy   Number Minutes Cryotherapy  20 Minutes    Cryotherapy Location  Lumbar Spine  Type of Cryotherapy  Ice pack      Electrical Stimulation   Electrical Stimulation Location  Rt low back and hip    Electrical Stimulation Action  IFC    Electrical Stimulation Parameters  to tolerance     Electrical Stimulation Goals  Tone;Pain             PT Education - 04/17/17 [phone]    Education provided  Yes    Education Details  HEP, TENS info    Person(s) Educated  Patient    Methods  Explanation;Handout;Demonstration;Tactile cues;Verbal cues    Comprehension  Verbalized understanding;Returned demonstration          PT Long Term Goals - 04/14/17 0948      PT LONG TERM GOAL #1   Title  I with advanced HEP ( 05/12/17)    Time  4    Period  Weeks    Status  New      PT LONG TERM GOAL #2   Title  report no more than 1/10 Rt sided low back pain after a work shift ( 05/12/17)     Time  4    Period  Weeks    Status  New      PT LONG TERM GOAL #3   Title  demo full and painfree lumbar ROM ( 05/12/17)     Time  4    Period  Weeks    Status  New      PT LONG TERM GOAL #4   Title  improve bilat hamstring flexibility =/>  65 degrees ( 05/12/17)     Time  4    Period  Weeks    Status  New      PT LONG TERM GOAL #5   Title  improve FOTO =/< 34% limited ( 05/12/17)     Time  4    Period  Weeks    Status  New            Plan - 04/17/17 1753    Clinical Impression Statement  Pt continues with high level of low back pain. He had difficulty tolerating exercises in sitting/standing; slightly improved tolerance in hooklying position.  Pt reported reduction of pain by 2 points with lumbar extension exercises and further reduction of pain with ice/estim.  Pain returned with transitional movement to standing.      Rehab Potential  Good    PT Frequency  2x / week    PT Duration  4 weeks    PT Treatment/Interventions  Dry needling;Manual techniques;Moist Heat;Ultrasound;Therapeutic activities;Patient/family education;Therapeutic exercise;Cryotherapy;Electrical Stimulation;Passive range of motion    PT Next Visit Plan  manual work Rt low back, cont ext based exercise and hamstring flexibility.     Consulted and Agree with Plan of Care  Patient       Patient will benefit from skilled therapeutic intervention in order to improve the following deficits and impairments:  Improper body mechanics, Pain, Postural dysfunction, Increased muscle spasms, Hypomobility, Impaired flexibility  Visit Diagnosis: Acute right-sided low back pain without sciatica  Stiffness of right hip, not elsewhere classified     Problem List There are no active problems to display for this patient.  Mayer CamelJennifer Carlson-Long, PTA 04/17/17 5:56 PM  [facility] [place] [address] [place], [place], [address] Phone: [phone]   Fax:  [phone]  Name: Scott Greene MRN: [id number] Date of Birth: 12-10-1990

## 2017-04-21 ENCOUNTER — Ambulatory Visit (INDEPENDENT_AMBULATORY_CARE_PROVIDER_SITE_OTHER): Payer: BLUE CROSS/BLUE SHIELD | Admitting: Family Medicine

## 2017-04-21 ENCOUNTER — Ambulatory Visit: Payer: BLUE CROSS/BLUE SHIELD | Admitting: Physical Therapy

## 2017-04-21 ENCOUNTER — Encounter: Payer: Self-pay | Admitting: Family Medicine

## 2017-04-21 DIAGNOSIS — M62830 Muscle spasm of back: Secondary | ICD-10-CM | POA: Diagnosis not present

## 2017-04-21 DIAGNOSIS — M545 Low back pain, unspecified: Secondary | ICD-10-CM | POA: Insufficient documentation

## 2017-04-21 DIAGNOSIS — M25651 Stiffness of right hip, not elsewhere classified: Secondary | ICD-10-CM

## 2017-04-21 DIAGNOSIS — M25652 Stiffness of left hip, not elsewhere classified: Secondary | ICD-10-CM

## 2017-04-21 MED ORDER — HYDROCODONE-ACETAMINOPHEN 5-325 MG PO TABS: 1.0000 | ORAL_TABLET | Freq: Four times a day (QID) | ORAL | 0 refills | Status: DC | PRN

## 2017-04-21 NOTE — Patient Instructions (Signed)
Gluteal Sets    Tighten buttocks while pressing pelvis to floor. Hold _5-10___ seconds. Repeat _10___ times per set. Do __1_ sets per session. Do _several __ sessions per day.   [facility] [address] [place], [place] [address]  [phone]) [phone] (fax)

## 2017-04-21 NOTE — Patient Instructions (Addendum)
Thank you for coming in today. Attend PT.  Get MRI in the near future.  Continue to do PT exercises at home.  Continue heating pad and TENS unit.   Recheck with me after MRI or before 05/27/17.

## 2017-04-21 NOTE — Therapy (Signed)
[facility] [address] [place], [place], [address] Phone: [phone]   Fax:  [phone]  Physical Therapy Treatment  Patient Details  Name: Scott Greene Schue MRN: [id number] Date of Birth: [birth year] Referring Provider: Dr. Denyse Amassorey   Encounter Date: 04/21/2017  PT End of Session - 04/21/17 0937    Visit Number  3    Number of Visits  8    Date for PT Re-Evaluation  05/12/17    PT Start Time  0844    PT Stop Time  0945    PT Time Calculation (min)  61 min    Activity Tolerance  Patient limited by pain    Behavior During Therapy  [facility] for tasks assessed/performed       No past medical history on file.  No past surgical history on file.  There were no vitals filed for this visit.  Subjective Assessment - 04/21/17 0848    Subjective  [name] reports he feels about the same as last session.  "From the minute I wake up and throughout the day, the pain is constant".  He has tried to do the exercises daily and avoid recliner, without change.  He is sleeping on/off about 6 hrs / night; interrupted due to pain.     Patient Stated Goals  get rid of back pain.     Currently in Pain?  Yes    Pain Score  8     Pain Location  Back    Pain Orientation  Right    Pain Descriptors / Indicators  Nagging;Dull;Sharp    Aggravating Factors   prolonged walking and sitting, bending in all directions     Pain Relieving Factors  laying flat on back.          [facility] - 04/21/17 0001      Assessment   Medical Diagnosis  Lumbosacral strain    Referring Provider  Dr. Denyse Amassorey    Onset Date/Surgical Date  03/28/17    Hand Dominance  Right    Next MD Visit  04/22/17       [facility] Treatment/Exercise - 04/21/17 0001      Lumbar Exercises: Stretches   Passive Hamstring Stretch  2 reps;30 seconds PTA assist    Single Knee to Chest Stretch  1 rep;10 seconds increased LBP; stopped    Prone on Elbows Stretch  5 reps;30 seconds       Lumbar Exercises: Aerobic   Stationary Bike  NuStep L4: 5 min (slow speed)       Lumbar Exercises: Supine   Glut Set  -- 1 rep     Bridge  -- 4 reps, limited tolerance      Lumbar Exercises: Prone   Straight Leg Raise  -- 2 reps each side. limited tolerance RLE    Other Prone Lumbar Exercises  glute set x 5 sec x 5 reps      Modalities   Modalities  Electrical Stimulation      Moist Heat Therapy   Number Minutes Moist Heat  15 Minutes    Moist Heat Location  Lumbar Spine      Electrical Stimulation   Electrical Stimulation Location  Rt low back and hip    Electrical Stimulation Action  IFC    Electrical Stimulation Parameters  to tolerance     Electrical Stimulation Goals  Pain      Manual Therapy   Manual Therapy  Myofascial release;Soft tissue mobilization  Manual therapy comments  pt prone and in Lt sidelying.     Soft tissue mobilization  STM to Rt lumbar paraspinals, glutes, piriformis.   TPR to Rt QL and Rt glute med. TPR to Rt piriformis with contract relax.     Myofascial Release  MFR to Rt lat, QL, lumbar paraspinals with Rt arm overhead.               PT Education - 04/21/17 0946    Education provided  Yes    Education Details  HEP - add glute set    Person(s) Educated  Patient    Methods  Explanation;Demonstration;Handout;Verbal cues    Comprehension  Returned demonstration;Verbalized understanding          PT Long Term Goals - 04/21/17 0937      PT LONG TERM GOAL #1   Title  I with advanced HEP ( 05/12/17)    Time  4    Period  Weeks    Status  On-going      PT LONG TERM GOAL #2   Title  report no more than 1/10 Rt sided low back pain after a work shift ( 05/12/17)     Period  Weeks    Status  On-going      PT LONG TERM GOAL #3   Title  demo full and painfree lumbar ROM ( 05/12/17)     Time  4    Period  Weeks    Status  On-going      PT LONG TERM GOAL #4   Title  improve bilat hamstring flexibility =/> 65 degrees ( 05/12/17)      Time  4    Period  Weeks    Status  On-going      PT LONG TERM GOAL #5   Title  improve FOTO =/< 34% limited ( 05/12/17)     Time  4    Period  Weeks    Status  On-going            Plan - 04/21/17 3244    Clinical Impression Statement  Pt observed standing and sitting in a posterior pelvic tilt. Pt reported decrease in pain by 2-3 points with prone lumbar ext; encouraged pt to perform freq lumbar ext to decrease pain and assist in return to neutral spine in standing.  Noted decreased firing of Rt glute max muscle with exercise. Despite continued high pain level, pt was able to tolerate NuStep today and is standing more erect with VC vs last treatment.  Pt has had limited progress towards goals; only 3rd visit,    Rehab Potential  Good    PT Frequency  2x / week    PT Duration  4 weeks    PT Treatment/Interventions  Dry needling;Manual techniques;Moist Heat;Ultrasound;Therapeutic activities;Patient/family education;Therapeutic exercise;Cryotherapy;Electrical Stimulation;Passive range of motion    PT Next Visit Plan  manual work Rt low back, cont ext based exercise and hamstring flexibility.     Consulted and Agree with Plan of Care  Patient       Patient will benefit from skilled therapeutic intervention in order to improve the following deficits and impairments:  Improper body mechanics, Pain, Postural dysfunction, Increased muscle spasms, Hypomobility, Impaired flexibility  Visit Diagnosis: Acute right-sided low back pain without sciatica  Stiffness of right hip, not elsewhere classified  Stiffness of left hip, not elsewhere classified  Muscle spasm of back     Problem List There are no active problems to display  for this patient.  Mayer CamelJennifer Carlson-Long, PTA 04/21/17 9:52 AM  [facility] [address] [place], [place], [address] Phone: [phone]   Fax:  [phone]  Name: Scott Greene Goren MRN:  [id number] Date of Birth: 1990/07/11

## 2017-04-21 NOTE — Progress Notes (Signed)
Scott Greene is a 26 y.o. male who [facility] today for follow up back pain.   Scott Greene continues to experience back pain for the last 4 weeks or so.  He denies any radiating pain weakness or numbness or bowel bladder dysfunction. He notes the pain is worse with activity and better with rest. He has attended several sessions of physical therapy but has not yet improved. He uses over-the-counter medications as well as hydrocodone for pain control which do help. Additionally he uses methocarbamol which does help. He is unable to work as the pain is quite severe and interferes with ability to bend push-pull left or climbing.Marland Kitchen.   No past medical history on file. No past surgical history on file. Social History   Tobacco Use  . Smoking status: Current Every Day Smoker  . Smokeless tobacco: Current User  Substance Use Topics  . Alcohol use: Yes     ROS:  As above   Medications: Current Outpatient Medications  Medication Sig Dispense Refill  . HYDROcodone-acetaminophen (NORCO/VICODIN) 5-325 MG tablet Take 1 tablet by mouth every 6 (six) hours as needed. 15 tablet 0  . methocarbamol (ROBAXIN) 500 MG tablet Take 1 tablet (500 mg total) 3 (three) times daily by mouth. 90 tablet 0   No current facility-administered medications for this visit.    No Known Allergies   Exam:  BP 132/82   Pulse 71   Ht 6\' 2"  (1.88 m)   Wt 184 lb (83.5 kg)   BMI 23.62 kg/m  General: Well Developed, well nourished, and in no acute distress.  Neuro/Psych: Alert and oriented x3, extra-ocular muscles intact, able to move all 4 extremities, sensation grossly intact. Skin: Warm and dry, no rashes noted.  Respiratory: Not using accessory muscles, speaking in full sentences, trachea midline.  Cardiovascular: Pulses palpable, no extremity edema. Abdomen: Does not appear distended. MSK: L spine: Nontender to spinal midline. Back motion is limited due to  pain Lower extremity strength is intact throughout Sensation is intact throughout Antalgic gait present   No results found for this or any previous visit (from the past 48 hour(s)). No results found.    Assessment and Plan: 26 y.o. male with lumbago without sciatica failing conservative management unable to work. Plan to continue intermittent Norco for pain control and continue physical therapy. We'll arrange for MRI as patient has experienced pain now for almost 6 weeks. Previous x-ray was unremarkable.Recheck in 2-4 weeks.    Orders Placed This Encounter  Procedures  . MR Lumbar Spine Wo Contrast    Standing Status:   Future    Standing Expiration Date:   06/22/2018    Order Specific Question:   What is the patient's sedation requirement?    Answer:   No Sedation    Order Specific Question:   Does the patient have a pacemaker or implanted devices?    Answer:   No    Order Specific Question:   [facility]erred imaging [facility]?    [facility]    Order Specific Question:   Radiology Contrast Protocol - do NOT remove file path    Answer:   [email or web address]   Meds ordered this encounter  Medications  . HYDROcodone-acetaminophen (NORCO/VICODIN) 5-325 MG tablet    Sig: Take 1 tablet by mouth every 6 (six) hours as needed.    Dispense:  15 tablet    Refill:  0    Discussed warning signs or  symptoms. Please see discharge instructions. Patient expresses understanding.  I spent 25 minutes with this patient, greater than 50% was face-to-face time counseling regarding ddx and treatment plan.  Patient researched [place] Controlled Substance Reporting System.

## 2017-04-24 ENCOUNTER — Ambulatory Visit (INDEPENDENT_AMBULATORY_CARE_PROVIDER_SITE_OTHER): Payer: BLUE CROSS/BLUE SHIELD | Admitting: Physical Therapy

## 2017-04-24 ENCOUNTER — Encounter: Payer: Self-pay | Admitting: Physical Therapy

## 2017-04-24 ENCOUNTER — Telehealth: Payer: Self-pay

## 2017-04-24 DIAGNOSIS — M62830 Muscle spasm of back: Secondary | ICD-10-CM

## 2017-04-24 DIAGNOSIS — M545 Low back pain, unspecified: Secondary | ICD-10-CM

## 2017-04-24 DIAGNOSIS — M25651 Stiffness of right hip, not elsewhere classified: Secondary | ICD-10-CM

## 2017-04-24 DIAGNOSIS — M25652 Stiffness of left hip, not elsewhere classified: Secondary | ICD-10-CM | POA: Diagnosis not present

## 2017-04-24 NOTE — Telephone Encounter (Signed)
Scott JacobsonHelen from Imaging called stated thatPatient requested a early refill on his Hydrocodone for tomorrow due to there may be snow on Monday and he may run out. Patient stated that he gets this medication weekly. .please advise patient has MRI scheduled for 12/17, according to helen.[name],CMA

## 2017-04-24 NOTE — Therapy (Signed)
[facility] [address] [place], [place], [address] Phone: [phone]   Fax:  [phone]  Physical Therapy Treatment  Patient Details  Name: Scott Greene MRN: [id number] Date of Birth: 1990/08/19 Referring Provider: Dr. Denyse Amassorey   Encounter Date: 04/24/2017  PT End of Session - 04/24/17 0847    Visit Number  4    Number of Visits  8    Date for PT Re-Evaluation  05/12/17    PT Start Time  0844    PT Stop Time  0932    PT Time Calculation (min)  48 min    Activity Tolerance  Patient tolerated treatment well;Patient limited by pain    Behavior During Therapy  [facility] for tasks assessed/performed       History reviewed. No pertinent past medical history.  History reviewed. No pertinent surgical history.  There were no vitals filed for this visit.  Subjective Assessment - 04/24/17 0848    Subjective  Pt reports his pain is still constant, but the pain level fluctuates.  He has been doing exercises daily.  He got his TENS working and has used it once since last session.     Currently in Pain?  Yes    Pain Score  6     Pain Location  Back    Pain Orientation  Right    Pain Descriptors / Indicators  Nagging    Aggravating Factors   prolonged walking, sitting, bending    Pain Relieving Factors  laying flat on back.          [facility] PT Assessment - 04/24/17 0001      Assessment   Medical Diagnosis  Lumbosacral strain    Referring Provider  Dr. Denyse Amassorey    Onset Date/Surgical Date  03/28/17    Hand Dominance  Right    Next MD Visit  to schedule after MRI      Flexibility   Hamstrings  supine SLR Lt 35, Rt 40                  [facility] Adult PT Treatment/Exercise - 04/24/17 0001      Lumbar Exercises: Stretches   Passive Hamstring Stretch  30 seconds;3 reps    Hip Flexor Stretch  2 reps;30 seconds standing with straight leg extended    Prone on Elbows Stretch  60 seconds;4 reps performed throughout treatment     Piriformis Stretch  Limitations;2 reps fig 4 with knee towards opp shoulder; 45 sec reps      Lumbar Exercises: Aerobic   Stationary Bike  NuStep L3-5: 4.5 min  slow speed.       Lumbar Exercises: Prone   Straight Leg Raise  5 reps 2 sets    Opposite Arm/Leg Raise  Right arm/Left leg;Left arm/Right leg;5 reps 2 sets with rest break in between    Other Prone Lumbar Exercises  glute set x 5 sec x 5 reps weak firing on Rt side      Lumbar Exercises: Quadruped   Other Quadruped Lumbar Exercises  attempt of childs pose x 2, unable to make it to quadruped position due to increased Rt LBP.       Modalities   Modalities  Electrical Stimulation;Ultrasound      Moist Heat Therapy   Number Minutes Moist Heat  --      Electrical Stimulation   Electrical Stimulation Location  Rt lumbar paraspinals/ QL/glute med    Engineer, manufacturinglectrical Stimulation Action  combo [place]  Electrical Stimulation Parameters  to tolerance     Electrical Stimulation Goals  Pain;Tone      Ultrasound   Ultrasound Location  Rt lumbar paraspinals/QL/glute med    Ultrasound Parameters  combo US: 100%, 1.3w/cm2, 10 min     Ultrasound Goals  Pain;Other (Comment) tightness                  PT Long Term Goals - 04/21/17 0937      PT LONG TERM GOAL #1   Title  I with advanced HEP ( 05/12/17)    Time  4    Period  Weeks    Status  On-going      PT LONG TERM GOAL #2   Title  report no more than 1/10 Rt sided low back pain after a work shift ( 05/12/17)     Period  Weeks    Status  On-going      PT LONG TERM GOAL #3   Title  demo full and painfree lumbar ROM ( 05/12/17)     Time  4    Period  Weeks    Status  On-going      PT LONG TERM GOAL #4   Title  improve bilat hamstring flexibility =/> 65 degrees ( 05/12/17)     Time  4    Period  Weeks    Status  On-going      PT LONG TERM GOAL #5   Title  improve FOTO =/< 34% limited ( 05/12/17)     Time  4    Period  Weeks    Status  On-going             Plan - 04/24/17 0945    Clinical Impression Statement  Pt continues with very tight hamstrings (35-40 deg SLR with hamstring stretch). Pt able to tolerate increased ext based exercises without increase in pain. Pt reported reduction of pain with combo US.  Pt making gradual progress towards established goals.     Rehab Potential  Good    PT Frequency  2x / week    PT Duration  4 weeks    PT Treatment/Interventions  Dry needling;Manual techniques;Moist Heat;Ultrasound;Therapeutic activities;Patient/family education;Therapeutic exercise;Cryotherapy;Electrical Stimulation;Passive range of motion    PT Next Visit Plan  manual work / DN , cont ext based exercise and hamstring flexibility. Add prone opp arm/leg ext to HEP if tolerated.     Consulted and Agree with Plan of Care  Patient       Patient will benefit from skilled therapeutic intervention in order to improve the following deficits and impairments:  Improper body mechanics, Pain, Postural dysfunction, Increased muscle spasms, Hypomobility, Impaired flexibility  Visit Diagnosis: Acute right-sided low back pain without sciatica  Muscle spasm of back  Stiffness of right hip, not elsewhere classified  Stiffness of left hip, not elsewhere classified     Problem List Patient Active Problem List   Diagnosis Date Noted  . Lumbago 04/21/2017   Mayer CamelJennifer Carlson-Long, PTA 04/24/17 9:57 AM  [facility] [address] [place], [place], [address] Phone: [phone]   Fax:  [phone]  Name: Scott Greene MRN: [id number] Date of Birth: [birth year]

## 2017-04-24 NOTE — Telephone Encounter (Signed)
That is way too fast to run out of pain medicine. We cannot refill this medicine until after the MRI.

## 2017-04-24 NOTE — Telephone Encounter (Signed)
Spoke to patient gave him advise as noted below. Rhonda Cunningham,CMA  

## 2017-04-26 DIAGNOSIS — M47896 Other spondylosis, lumbar region: Secondary | ICD-10-CM | POA: Diagnosis not present

## 2017-04-26 DIAGNOSIS — M5127 Other intervertebral disc displacement, lumbosacral region: Secondary | ICD-10-CM | POA: Diagnosis not present

## 2017-04-26 DIAGNOSIS — M47897 Other spondylosis, lumbosacral region: Secondary | ICD-10-CM | POA: Diagnosis not present

## 2017-04-26 DIAGNOSIS — M5126 Other intervertebral disc displacement, lumbar region: Secondary | ICD-10-CM | POA: Diagnosis not present

## 2017-04-29 ENCOUNTER — Telehealth: Payer: Self-pay | Admitting: Family Medicine

## 2017-04-29 ENCOUNTER — Encounter: Payer: Self-pay | Admitting: Family Medicine

## 2017-04-29 ENCOUNTER — Ambulatory Visit (INDEPENDENT_AMBULATORY_CARE_PROVIDER_SITE_OTHER): Payer: BLUE CROSS/BLUE SHIELD | Admitting: Family Medicine

## 2017-04-29 ENCOUNTER — Encounter: Payer: BLUE CROSS/BLUE SHIELD | Admitting: Physical Therapy

## 2017-04-29 VITALS — BP 136/84 | HR 86 | Ht 74.0 in | Wt 186.0 lb

## 2017-04-29 DIAGNOSIS — M545 Low back pain, unspecified: Secondary | ICD-10-CM

## 2017-04-29 MED ORDER — HYDROCODONE-ACETAMINOPHEN 5-325 MG PO TABS: 1.0000 | ORAL_TABLET | Freq: Every evening | ORAL | 0 refills | Status: DC | PRN

## 2017-04-29 NOTE — Telephone Encounter (Signed)
Pt advised and scheduled.

## 2017-04-29 NOTE — Progress Notes (Signed)
Scott Greene is a 26 y.o. male who [facility] for [facility].    [name] continues to experience predominately right lower back pain.   The pain is continuous but is worse with activity. He denies any pain radiating down the leg and no weakness or bowel or bladder dysfunction.  He has tried OTC medicines which do not work well. He has used hydrocodone which helps some. He's had a few sessions of physical therapy but is not yet better. He is unable to work because the pain is quite bad.  No past medical history on file. No past surgical history on file. Social History   Tobacco Use  . Smoking status: Current Every Day Smoker  . Smokeless tobacco: Current User  Substance Use Topics  . Alcohol use: Yes     ROS:  As above   Medications: Current Outpatient Medications  Medication Sig Dispense Refill  . HYDROcodone-acetaminophen (NORCO/VICODIN) 5-325 MG tablet Take 1 tablet by mouth at bedtime as needed. 15 tablet 0  . methocarbamol (ROBAXIN) 500 MG tablet Take 1 tablet (500 mg total) 3 (three) times daily by mouth. 90 tablet 0   No current facility-administered medications for this visit.    No Known Allergies   Exam:  BP 136/84   Pulse 86   Ht 6\' 2"  (1.88 m)   Wt 186 lb (84.4 kg)   BMI 23.88 kg/m  General: Well Developed, well nourished, and in no acute distress.  Neuro/Psych: Alert and oriented x3, extra-ocular muscles intact, able to move all 4 extremities, sensation grossly intact. Skin: Warm and dry, no rashes noted.  Respiratory: Not using accessory muscles, speaking in full sentences, trachea midline.  Cardiovascular: Pulses palpable, no extremity edema. Abdomen: Does not appear distended. MSK:  L spine: Nontender to midline. Tender palpation right lumbar paraspinal muscle group. Decreased motion due to pain. Lower extremity strength is intact. Mild antalgic gait.  MRI Spine Lumbar WO IV  Contrast12/02/2017 [facility] Result Impression  IMPRESSION:  1. Small right distal foraminal/far lateral disc protrusion at L4-L5 which abuts the exiting right L4 nerve. Correlate with radicular symptoms.  2. Small central disc protrusion at L5-S1 which abuts but does not significantly displace the descending S1 nerve roots. No significant stenosis demonstrated.  Electronically Signed by: Wonda ChengMichael Reardon  Result Narrative  MRI lumbar spine:  INDICATION: Right-sided low back pain  TECHNIQUE: Sagittal and axial T1 and T2-weighted sequences were performed. Additional sagittal STIR images were performed.  COMPARISON: CT abdomen pelvis from 10/18/2016.  FINDINGS:  Last well-formed disc space designated L5-S1 for the purposes of this report.  #Vertebral bodies: No compression fracture. #Alignment: No malalignment. Straightening of the normal lumbar lordosis. #Marrow signal: Chronic appearing degenerative endplate change at L5-S1. No significant bony edema. #Conus medullaris: Normal. Terminates at L1 with no acute abnormality.  #Lower thoracic segments: No significant abnormality. #No acute paraspinal soft tissue abnormality.  #L1-2: No significant stenosis #L2-3: No significant stenosis #L3-4: No significant stenosis #L4-5: Small disc bulge. Mild facet arthropathy. Small superimposed right distal foraminal/far lateral disc protrusion which abuts the exiting right L4 nerve. No significant stenosis. #L5-S1: Mild facet arthropathy. Small central disc protrusion which abuts but does not significantly displace the descending S1 nerve roots. No significant stenosis.        Assessment and Plan: 26 y.o. male with  Acute back pain. Very likely myofascial disruption and dysfunction and spasm.  This should improve with PT. 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 Will arrange for right L4-L5 and L5-S1 facet injections.  Will also refill NORCO. We  discussed the risk of addiction and the importance of using this medicine sparingly.   Patient researched [facility] [place] Controlled Sub[facility] Reporting System.    Orders Placed This Encounter  Procedures  . DG FACET JT INJ L /S  2ND LEVEL LEFT W/FL/CT    Standing Status:   Future    Standing Expiration Date:   06/30/2018    Order Specific Question:   Reason for Exam (SYMPTOM  OR DIAGNOSIS REQUIRED)    Answer:   RT L4/L5    Order Specific Question:   Preferred imaging location?    Answer:   [facility]  . DG FACET JT INJ L /S SINGLE LEVEL RIGHT W/FL/CT    Order Specific Question:   Reason for exam:    Answer:   Right L5/S1    Order Specific Question:   Preferred imaging location?    Answer:   [facility]   Meds ordered this encounter  Medications  . HYDROcodone-acetaminophen (NORCO/VICODIN) 5-325 MG tablet    Sig: Take 1 tablet by mouth at bedtime as needed.    Dispense:  15 tablet    Refill:  0    Discussed warning signs or symptoms. Please see discharge instructions. Patient expresses understanding.  I spent 25 minutes with this patient, greater than 50% was face-to-face time counseling regarding ddx and treatment plan.

## 2017-04-29 NOTE — Telephone Encounter (Signed)
[facility] Would like refill on pain meds. Will route.

## 2017-04-29 NOTE — Patient Instructions (Addendum)
Thank you for coming in today. Continue PT.  You should hear about back injections in the next few days.  If you dont hear anything call my office and let me know.  Return sooner if needed.  Let check back in 2-3 weeks.  When you return try to bring the MRI CD. [facility] can make you one.

## 2017-04-29 NOTE — Telephone Encounter (Signed)
Please schedule a follow up visit. Get an image CD made form [facility] and bring it to the next visit.  I have appointment available today.

## 2017-04-30 ENCOUNTER — Encounter: Payer: BLUE CROSS/BLUE SHIELD | Admitting: Physical Therapy

## 2017-05-01 ENCOUNTER — Ambulatory Visit (INDEPENDENT_AMBULATORY_CARE_PROVIDER_SITE_OTHER): Payer: BLUE CROSS/BLUE SHIELD | Admitting: Physical Therapy

## 2017-05-01 ENCOUNTER — Encounter: Payer: Self-pay | Admitting: Physical Therapy

## 2017-05-01 DIAGNOSIS — M25652 Stiffness of left hip, not elsewhere classified: Secondary | ICD-10-CM

## 2017-05-01 DIAGNOSIS — M25651 Stiffness of right hip, not elsewhere classified: Secondary | ICD-10-CM

## 2017-05-01 DIAGNOSIS — M545 Low back pain, unspecified: Secondary | ICD-10-CM

## 2017-05-01 DIAGNOSIS — M62830 Muscle spasm of back: Secondary | ICD-10-CM | POA: Diagnosis not present

## 2017-05-01 NOTE — Therapy (Signed)
[facility] [address] [place], [place], [address] Phone: [phone]   Fax:  [phone]  Physical Therapy Treatment  Patient Details  Name: Scott Greene MRN: [id number] Date of Birth: 08-19-1990 Referring Provider: Dr. Denyse Amassorey   Encounter Date: 05/01/2017  PT End of Session - 05/01/17 1012    Visit Number  5    Number of Visits  8    Date for PT Re-Evaluation  05/12/17    PT Start Time  1012    PT Stop Time  1057    PT Time Calculation (min)  45 min    Activity Tolerance  Patient limited by pain       History reviewed. No pertinent past medical history.  History reviewed. No pertinent surgical history.  There were no vitals filed for this visit.  Subjective Assessment - 05/01/17 1015    Subjective  Pt reports he is still having the pain in his low back and Rt thigh.  He reports the severe pain is not as severe as it was. He is going to try and by a new mattress this weekend    Patient Stated Goals  get rid of back pain.     Currently in Pain?  Yes    Pain Score  7     Pain Location  Back    Pain Orientation  Right    Pain Descriptors / Indicators  Aching;Stabbing    Pain Type  Acute pain    Pain Onset  More than a month ago    Pain Frequency  Constant    Aggravating Factors   any weight bearing and bending    Pain Relieving Factors  lying flat with feet elevated.          [facility] PT Assessment - 05/01/17 0001      Assessment   Medical Diagnosis  Lumbosacral strain      Flexibility   Hamstrings  supine SRL Lt 60, Rt 57                  [facility] Adult PT Treatment/Exercise - 05/01/17 0001      Lumbar Exercises: Stretches   Passive Hamstring Stretch  2 reps 45 sec each side, modified       Lumbar Exercises: Aerobic   Stationary Bike  L5 x 5'      Lumbar Exercises: Supine   Ab Set  5 reps;5 seconds    Bridge  -- 6 reps, stopped d/t pain    Isometric Hip Flexion  5 reps;5 seconds each  side      Lumbar Exercises: Prone   Other Prone Lumbar Exercises  pelvic press x 5, 2x5 reps press with bilat knee bends, then 2x5 press with hip extension    Other Prone Lumbar Exercises  POE      Modalities   Modalities  Traction;Moist Heat pt  wt 185      Moist Heat Therapy   Number Minutes Moist Heat  15 Minutes    Moist Heat Location  Lumbar Spine      Traction   Type of Traction  Lumbar in prone    Min (lbs)  40    Max (lbs)  50    Hold Time  40    Rest Time  20    Time  15                  PT Long Term Goals - 05/01/17 1026  PT LONG TERM GOAL #1   Title  I with advanced HEP ( 05/12/17)    Status  On-going      PT LONG TERM GOAL #2   Title  report no more than 1/10 Rt sided low back pain after a work shift ( 05/12/17)     Status  On-going      PT LONG TERM GOAL #3   Title  demo full and painfree lumbar ROM ( 05/12/17)     Status  On-going      PT LONG TERM GOAL #4   Title  improve bilat hamstring flexibility =/> 65 degrees ( 05/12/17)     Status  On-going      PT LONG TERM GOAL #5   Title  improve FOTO =/< 34% limited ( 05/12/17)     Status  On-going            Plan - 05/01/17 1028    Clinical Impression Statement  Scott Greene continues to have a lot of low back and Rt thigh pain, no real change since starting PT, MRI showed 2 areas of disc issues. Still responds positively to extension, he has increased hamstring flexibility today however is still tight. Added lumbar traction in prone to POC, didn't tolerate a lot of pull with traction, will ease up as able.     Rehab Potential  Good    PT Frequency  2x / week    PT Duration  4 weeks    PT Treatment/Interventions  Dry needling;Manual techniques;Moist Heat;Ultrasound;Therapeutic activities;Patient/family education;Therapeutic exercise;Cryotherapy;Electrical Stimulation;Passive range of motion;Traction    PT Next Visit Plan  assess response to traction ( performed in prone)     Consulted and  Agree with Plan of Care  Patient       Patient will benefit from skilled therapeutic intervention in order to improve the following deficits and impairments:  Improper body mechanics, Pain, Postural dysfunction, Increased muscle spasms, Hypomobility, Impaired flexibility  Visit Diagnosis: Acute right-sided low back pain without sciatica  Muscle spasm of back  Stiffness of right hip, not elsewhere classified  Stiffness of left hip, not elsewhere classified     Problem List Patient Active Problem List   Diagnosis Date Noted  . Lumbago 04/21/2017    Roderic ScarceSusan Essie Gehret PT 05/01/2017, 10:45 AM  [facility] [address] [place], [place], [address] Phone: [phone]   Fax:  [phone]  Name: Scott Greene MRN: [id number] Date of Birth: Mar 19, 1991

## 2017-05-05 ENCOUNTER — Encounter: Payer: BLUE CROSS/BLUE SHIELD | Admitting: Physical Therapy

## 2017-05-05 ENCOUNTER — Other Ambulatory Visit: Payer: BLUE CROSS/BLUE SHIELD

## 2017-05-07 ENCOUNTER — Ambulatory Visit (INDEPENDENT_AMBULATORY_CARE_PROVIDER_SITE_OTHER): Payer: BLUE CROSS/BLUE SHIELD | Admitting: Physical Therapy

## 2017-05-07 ENCOUNTER — Encounter: Payer: Self-pay | Admitting: Physical Therapy

## 2017-05-07 DIAGNOSIS — M62830 Muscle spasm of back: Secondary | ICD-10-CM

## 2017-05-07 DIAGNOSIS — M25651 Stiffness of right hip, not elsewhere classified: Secondary | ICD-10-CM | POA: Diagnosis not present

## 2017-05-07 DIAGNOSIS — M25652 Stiffness of left hip, not elsewhere classified: Secondary | ICD-10-CM | POA: Diagnosis not present

## 2017-05-07 DIAGNOSIS — M545 Low back pain, unspecified: Secondary | ICD-10-CM

## 2017-05-07 NOTE — Therapy (Signed)
[place], [place], [address] Phone: [phone]   Fax:  [phone]  Physical Therapy Treatment  Patient Details  Name: Scott Greene MRN: [id number] Date of Birth: 05/10/1991 Referring Provider: Dr. [name]   Encounter Date: 05/07/2017  PT End of Session - 05/07/17 1406    Visit Number  6    Number of Visits  8    Date for PT Re-Evaluation  05/12/17    PT Start Time  1402    PT Stop Time  1452    PT Time Calculation (min)  50 min       History reviewed. No pertinent past medical history.  History reviewed. No pertinent surgical history.  There were no vitals filed for this visit.  Subjective Assessment - 05/07/17 1406    Subjective  Pt reports he has felt worse over last 2-3 days.  He tried to pick up laundry basket, "the higher I picked up the basket, the more pain I had.  and it wasn't heavy at all".  He has tried to sit up straighter on couch and has been trying to walk more.     Currently in Pain?  Yes    Pain Score  8     Pain Location  Back    Pain Orientation  Right    Pain Descriptors / Indicators  Stabbing;Aching    Pain Radiating Towards  down to back of knee     Aggravating Factors   any bending     Pain Relieving Factors  lying flat with feet elevated.          [facility] PT Assessment - 05/07/17 0001      Assessment   Medical Diagnosis  Lumbosacral strain    Referring Provider  Dr. [name]    Onset Date/Surgical Date  03/28/17    Hand Dominance  Right    Next MD Visit  not scheduled yet.       Palpation   SI assessment   Rt posterior sulcus deeper than Lt (Lt on Lt sacral torsion).  Rt ASIS lower than Lt.  Ilium height appear = in standing.                   [facility] Adult PT Treatment/Exercise - 05/07/17 0001      Lumbar Exercises: Stretches   Hip Flexor Stretch  2 reps;60 seconds standing with straight leg extended    Prone on Elbows Stretch  60 seconds;2 reps       Lumbar Exercises: Prone   Other Prone Lumbar Exercises  pelvic press hold x 5 sec x 10; pelvic press with unilateral knee bend x 5 reps each leg; then with hip ext knee straight x 5 each leg. pain increased with Rt hip ext, stopped.       Moist Heat Therapy   Number Minutes Moist Heat  15 Minutes    Moist Heat Location  Lumbar Spine during traction      Electrical Stimulation   Electrical Stimulation Location  Rt lumbar paraspinals/ QL/glute med during traction    Electrical Stimulation Action  IFC    Electrical Stimulation Parameters  to tolerance     Electrical Stimulation Goals  Pain      Traction   Min (lbs)  50    Max (lbs)  60    Hold Time  60    Rest Time  20    Time  15  Manual Therapy   Manual Therapy  Muscle Energy Technique    Muscle Energy Technique  MET for ant rot ilium; unable to tolerate - stopped.                   PT Long Term Goals - 05/01/17 1026      PT LONG TERM GOAL #1   Title  I with advanced HEP ( 05/12/17)    Status  On-going      PT LONG TERM GOAL #2   Title  report no more than 1/10 Rt sided low back pain after a work shift ( 05/12/17)     Status  On-going      PT LONG TERM GOAL #3   Title  demo full and painfree lumbar ROM ( 05/12/17)     Status  On-going      PT LONG TERM GOAL #4   Title  improve bilat hamstring flexibility =/> 65 degrees ( 05/12/17)     Status  On-going      PT LONG TERM GOAL #5   Title  improve FOTO =/< 34% limited ( 05/12/17)     Status  On-going            Plan - 05/07/17 1533    Clinical Impression Statement  Pt's Rt ilium appears to be anteriorly rotated in standing, 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  Pt tolerated all exercises execept prone hip ext with pelvic press.  He reported elimination of symptoms in hooklying traction wiht increased pull of 60#, however he reported he stiffened up upon standing afterwards.  Pt making gradual progress towards goals.      Rehab Potential  Good    PT  Frequency  2x / week    PT Duration  4 weeks    PT Treatment/Interventions  Dry needling;Manual techniques;Moist Heat;Ultrasound;Therapeutic activities;Patient/family education;Therapeutic exercise;Cryotherapy;Electrical Stimulation;Passive range of motion;Traction    PT Next Visit Plan  assess response to traction ( performed in supine). Pt to have injections next wk. Possible DN/manual therapy of Rt psoas.     Consulted and Agree with Plan of Care  Patient       Patient will benefit from skilled therapeutic intervention in order to improve the following deficits and impairments:  Improper body mechanics, Pain, Postural dysfunction, Increased muscle spasms, Hypomobility, Impaired flexibility  Visit Diagnosis: Acute right-sided low back pain without sciatica  Muscle spasm of back  Stiffness of right hip, not elsewhere classified  Stiffness of left hip, not elsewhere classified     Problem List Patient Active Problem List   Diagnosis Date Noted  . Lumbago 04/21/2017   Kerin Perna, PTA 05/07/17 3:45 PM  [facility] [place], [place], [address] Phone: [phone]   Fax:  [phone]  Name: Scott Greene MRN: [id number] Date of Birth: 1990-11-07

## 2017-05-09 ENCOUNTER — Telehealth: Payer: Self-pay | Admitting: Family Medicine

## 2017-05-09 ENCOUNTER — Telehealth: Payer: Self-pay

## 2017-05-09 NOTE — Telephone Encounter (Signed)
Dr. Denyse Amassorey please see note below from patient, he is requesting a refill for his Hydrocodone. He stated that he needs enough to last him until he gets his injection on Thursday. Eldred Lievanos,CMA

## 2017-05-09 NOTE — Telephone Encounter (Signed)
Patient dropped off MRI CD for your review. I put it in your box. He also said that you possibly wanted to schedule an appointment with him so that you both could review the CD together. Let me know, and I will call patient to schedule.  Also, patient stated that he was out of pain medication and was wondering if you could send in another prescription for him. Please advise. Thank you!

## 2017-05-14 ENCOUNTER — Encounter: Payer: BLUE CROSS/BLUE SHIELD | Admitting: Physical Therapy

## 2017-05-14 NOTE — Telephone Encounter (Signed)
Error. Scott Greene,CMA  

## 2017-05-15 ENCOUNTER — Ambulatory Visit
Admission: RE | Admit: 2017-05-15 | Discharge: 2017-05-15 | Disposition: A | Discharge status: Home / Self Care | Payer: BLUE CROSS/BLUE SHIELD | Source: Ambulatory Visit | Attending: Family Medicine | Admitting: Family Medicine

## 2017-05-15 DIAGNOSIS — M545 Low back pain, unspecified: Secondary | ICD-10-CM

## 2017-05-15 MED ORDER — METHYLPREDNISOLONE ACETATE 40 MG/ML INJ SUSP (RADIOLOG
120.0000 mg | Freq: Once | INTRAMUSCULAR | Status: AC
  Administered 2017-05-15: 120 mg via INTRA_ARTICULAR

## 2017-05-15 MED ORDER — IOPAMIDOL (ISOVUE-M 200) INJECTION 41%
1.0000 mL | Freq: Once | INTRAMUSCULAR | Status: AC
  Administered 2017-05-15: 1 mL via INTRA_ARTICULAR

## 2017-05-15 MED ORDER — HYDROCODONE-ACETAMINOPHEN 5-325 MG PO TABS: 1.0000 | ORAL_TABLET | Freq: Every evening | ORAL | 0 refills | Status: DC | PRN

## 2017-05-15 NOTE — Telephone Encounter (Signed)
Patient has been notified. Rhonda Cunningham,CMA  

## 2017-05-15 NOTE — Discharge Instructions (Signed)

## 2017-05-15 NOTE — Telephone Encounter (Signed)
Norco refilled

## 2017-05-19 ENCOUNTER — Encounter: Payer: BLUE CROSS/BLUE SHIELD | Admitting: Physical Therapy

## 2017-05-21 ENCOUNTER — Ambulatory Visit (INDEPENDENT_AMBULATORY_CARE_PROVIDER_SITE_OTHER): Payer: BLUE CROSS/BLUE SHIELD | Admitting: Physical Therapy

## 2017-05-21 DIAGNOSIS — M545 Low back pain, unspecified: Secondary | ICD-10-CM

## 2017-05-21 DIAGNOSIS — M25651 Stiffness of right hip, not elsewhere classified: Secondary | ICD-10-CM | POA: Diagnosis not present

## 2017-05-21 DIAGNOSIS — M25652 Stiffness of left hip, not elsewhere classified: Secondary | ICD-10-CM | POA: Diagnosis not present

## 2017-05-21 DIAGNOSIS — M62830 Muscle spasm of back: Secondary | ICD-10-CM | POA: Diagnosis not present

## 2017-05-21 NOTE — Therapy (Signed)
[place], [place], [address] Phone: [phone]   Fax:  [phone]  Physical Therapy Treatment  Patient Details  Name: Scott Greene MRN: [id number] Date of Birth: 05-08-1991 Referring Provider: Dr. [name]   Encounter Date: 05/21/2017  PT End of Session - 05/21/17 1518    Visit Number  7    Date for PT Re-Evaluation  05/12/17    PT Start Time  6962    PT Stop Time  1607    PT Time Calculation (min)  51 min    Activity Tolerance  Patient limited by pain       No past medical history on file.  No past surgical history on file.  There were no vitals filed for this visit.  Subjective Assessment - 05/21/17 1516    Subjective  Scott Greene had injections last Thursday, had pain initially then settled down a little bit, now the pain is back strong and more.     Patient Stated Goals  get rid of back pain.     Currently in Pain?  Yes    Pain Score  9     Pain Location  Back    Pain Orientation  Right    Pain Descriptors / Indicators  Sharp;Shooting    Pain Type  Acute pain    Pain Radiating Towards  down to Rt foot    Pain Onset  More than a month ago    Pain Frequency  Constant    Aggravating Factors   bending and moving    Pain Relieving Factors  nothing now         [facility] PT Assessment - 05/21/17 0001      Assessment   Medical Diagnosis  Lumbosacral strain                  [facility] Adult PT Treatment/Exercise - 05/21/17 0001      Modalities   Modalities  Electrical Stimulation;Moist Heat;Traction;Ultrasound      Moist Heat Therapy   Number Minutes Moist Heat  20 Minutes    Moist Heat Location  Lumbar Spine      Electrical Stimulation   Electrical Stimulation Location  Rt lumbar paraspinals/ QL/glute med    Printmaker Action  IFC    Electrical Stimulation Parameters  to tolerance    Electrical Stimulation Goals  Wound      Ultrasound   Ultrasound Location  Rt  lumbar paraspinals and gluts/piriformis    Ultrasound Parameters  combo US/stim 100%, 1.40mz, 1.5 w/cm2    Ultrasound Goals  Pain tone      Traction   Type of Traction  Lumbar supine    Min (lbs)  55    Max (lbs)  75 dropped to 70# after 5'.     Hold Time  60    Rest Time  20    Time  19      Manual Therapy   Joint Mobilization  lumbar CPA and bilat UPA mobs, hypomobile in sacrum to L3, then good mobility in upper lumbar vertebrae    Soft tissue mobilization  STM to Rt upper glust, piriformisl lumbar paraspinals and QL.  Palpable tissue around the inferior Rt iliac crest, felt like fluid.                   PT Long Term Goals - 05/01/17 1026      PT LONG TERM GOAL #1   Title  I  with advanced HEP ( 05/12/17)    Status  On-going      PT LONG TERM GOAL #2   Title  report no more than 1/10 Rt sided low back pain after a work shift ( 05/12/17)     Status  On-going      PT LONG TERM GOAL #3   Title  demo full and painfree lumbar ROM ( 05/12/17)     Status  On-going      PT LONG TERM GOAL #4   Title  improve bilat hamstring flexibility =/> 65 degrees ( 05/12/17)     Status  On-going      PT LONG TERM GOAL #5   Title  improve FOTO =/< 34% limited ( 05/12/17)     Status  On-going            Plan - 05/21/17 1549    Clinical Impression Statement  Pt has slight relief after injections however now the pain has returned and is even a little worse. He has the most pain around the Rt SIJ at this time with palpation, has also developed some more muscular tightness in the Rt gluts/piriformis and lumbar paraspinals.  No goals met, he returns to referring MD this Friday. He hasn't made progress with therapy, still very limited due to pain.     Rehab Potential  Good    PT Frequency  2x / week    PT Duration  4 weeks    PT Treatment/Interventions  Dry needling;Manual techniques;Moist Heat;Ultrasound;Therapeutic activities;Patient/family education;Therapeutic  exercise;Cryotherapy;Electrical Stimulation;Passive range of motion;Traction    PT Next Visit Plan  will await until MD visit on Friday, if he feels it is beneficial to continue with therapy we will write a renewal, other wise we will discharge.     Consulted and Agree with Plan of Care  Patient       Patient will benefit from skilled therapeutic intervention in order to improve the following deficits and impairments:  Improper body mechanics, Pain, Postural dysfunction, Increased muscle spasms, Hypomobility, Impaired flexibility  Visit Diagnosis: Acute right-sided low back pain without sciatica  Muscle spasm of back  Stiffness of right hip, not elsewhere classified  Stiffness of left hip, not elsewhere classified     Problem List Patient Active Problem List   Diagnosis Date Noted  . Lumbago 04/21/2017    Jeral Pinch PT  05/21/2017, 3:57 PM  [facility] [place], [place], [address] Phone: [phone]   Fax:  [phone]  Name: Scott Greene MRN: [id number] Date of Birth: June 07, 1990

## 2017-05-23 ENCOUNTER — Ambulatory Visit (INDEPENDENT_AMBULATORY_CARE_PROVIDER_SITE_OTHER): Payer: BLUE CROSS/BLUE SHIELD | Admitting: Family Medicine

## 2017-05-23 ENCOUNTER — Ambulatory Visit (INDEPENDENT_AMBULATORY_CARE_PROVIDER_SITE_OTHER): Payer: BLUE CROSS/BLUE SHIELD | Admitting: Physical Therapy

## 2017-05-23 ENCOUNTER — Telehealth: Payer: Self-pay

## 2017-05-23 ENCOUNTER — Encounter: Payer: Self-pay | Admitting: Family Medicine

## 2017-05-23 VITALS — BP 129/80 | HR 71 | Wt 189.0 lb

## 2017-05-23 DIAGNOSIS — M545 Low back pain, unspecified: Secondary | ICD-10-CM

## 2017-05-23 DIAGNOSIS — G8929 Other chronic pain: Secondary | ICD-10-CM

## 2017-05-23 DIAGNOSIS — M25651 Stiffness of right hip, not elsewhere classified: Secondary | ICD-10-CM | POA: Diagnosis not present

## 2017-05-23 DIAGNOSIS — M62830 Muscle spasm of back: Secondary | ICD-10-CM | POA: Diagnosis not present

## 2017-05-23 MED ORDER — HYDROCODONE-ACETAMINOPHEN 5-325 MG PO TABS: 1.0000 | ORAL_TABLET | Freq: Every evening | ORAL | 0 refills | Status: DC | PRN

## 2017-05-23 NOTE — Telephone Encounter (Signed)
Please inform pharmacy I am ok with refill at this time.

## 2017-05-23 NOTE — Telephone Encounter (Signed)
Scott Greene called and states the pharmacy will not allow an early refill on the Hydrocodone. He states he talked to you about taking it twice daily after his recent injection. Please advise.

## 2017-05-23 NOTE — Telephone Encounter (Signed)
Pharmacy and patient advised

## 2017-05-23 NOTE — Progress Notes (Signed)
   Scott Greene is a 27 y.o. male who [facility] today for back pain.  Scott Greene continues to have persistent back pain.  His symptoms started in middle November after pulling his back at work.  He has had 6 weeks of physical therapy with no significant improvement a relatively benign-appearing MRI and a trial of facet injections none of which have provided significant pain relief.  He continues to experience significant pain and stiffness and has trouble lifting pushing or pulling at home and cannot work currently.   No past medical history on file. No past surgical history on file. Social History   Tobacco Use  . Smoking status: Current Every Day Smoker  . Smokeless tobacco: Current User  Substance Use Topics  . Alcohol use: Yes     ROS:  As above   Medications: Current Outpatient Medications  Medication Sig Dispense Refill  . HYDROcodone-acetaminophen (NORCO/VICODIN) 5-325 MG tablet Take 1 tablet by mouth at bedtime as needed. 30 tablet 0  . methocarbamol (ROBAXIN) 500 MG tablet Take 1 tablet (500 mg total) 3 (three) times daily by mouth. 90 tablet 0   No current facility-administered medications for this visit.    No Known Allergies   Exam:  BP 129/80   Pulse 71   Wt 189 lb (85.7 kg)   BMI 24.27 kg/m  General: Well Developed, well nourished, and in no acute distress.  Neuro/Psych: Alert and oriented x3, extra-ocular muscles intact, able to move all 4 extremities, sensation grossly intact. Skin: Warm and dry, no rashes noted.  Respiratory: Not using accessory muscles, speaking in full sentences, trachea midline.  Cardiovascular: Pulses palpable, no extremity edema. Abdomen: Does not appear distended. MSK:  L-spine: Nontender to midline.  Tender palpation bilateral lumbar paraspinals.  Decreased flexion and extension range of motion. Lower extremity strength is intact. Mild antalgic gait present.    No results  found for this or any previous visit (from the past 48 hour(s)). No results found.    Assessment and Plan: 27 y.o. male with back pain now chronic.  Scott Greene has not improved despite typical treatment options.  At this point I think is reasonable to refer to pain management.  Will prescribe 1 month of Norco and recheck as needed.  Work note updated.    Orders Placed This Encounter  Procedures  . Ambulatory referral to Pain Clinic    Referral Priority:   Routine    Referral Type:   Consultation    Referral Reason:   Specialty Services Required    Requested Specialty:   Pain Medicine    Number of Visits Requested:   1   Meds ordered this encounter  Medications  . HYDROcodone-acetaminophen (NORCO/VICODIN) 5-325 MG tablet    Sig: Take 1 tablet by mouth at bedtime as needed.    Dispense:  30 tablet    Refill:  0    Discussed warning signs or symptoms. Please see discharge instructions. Patient expresses understanding.  I spent 25 minutes with this patient, greater than 50% was face-to-face time counseling regarding ddx and treatment plan.

## 2017-05-23 NOTE — Patient Instructions (Signed)
Thank you for coming in today. You should hear from pain management soon.  Let me know if you do not hear anything in 1 week.  Try to use norco sparingly.  This should last 1 month.   Recheck in 1 month/.

## 2017-05-23 NOTE — Therapy (Addendum)
[place], [place], [address] Phone: [phone]   Fax:  [phone]  Physical Therapy Treatment  Patient Details  Name: Scott Greene MRN: [id number] Date of Birth: [birth year] Referring Provider: Dr. [name]   Encounter Date: 05/23/2017  PT End of Session - 05/23/17 0849    Visit Number  8    Number of Visits  8    PT Start Time  0850    PT Stop Time  0947    PT Time Calculation (min)  57 min    Activity Tolerance  Patient limited by pain    Behavior During Therapy  [facility] for tasks assessed/performed       No past medical history on file.  No past surgical history on file.  There were no vitals filed for this visit.  Subjective Assessment - 05/23/17 0850    Subjective  No new changes other than decrease in pain by "1-2 notches" after last treatment.    Patient Stated Goals  get rid of back pain.     Currently in Pain?  Yes    Pain Score  8     Pain Location  Back    Pain Descriptors / Indicators  Constant;Sharp    Pain Radiating Towards  (localized)    Aggravating Factors   bending and moving    Pain Relieving Factors  ??         [facility] PT Assessment - 05/23/17 0001      Flexibility   Hamstrings  RLE 64 deg;  LLE 57 deg     Palpation   SI assessment   In standing: Lt ilium slightly higher than Rt.  Lt ASIS slightly higer than Rt; Rt PSIS higher than Lt and causes shooting pain down RLE; Rt sacral torsion noted in prone          [facility] Adult PT Treatment/Exercise - 05/23/17 0001      Lumbar Exercises: Stretches   Passive Hamstring Stretch  2 reps 45 sec each side, modified     Prone on Elbows Stretch  60 seconds;2 reps      Moist Heat Therapy   Number Minutes Moist Heat  20 Minutes    Moist Heat Location  Lumbar Spine during traction       Electrical Stimulation   Electrical Stimulation Location  Rt lumbar paraspinals/ QL/glute med    Chartered certified accountant  combo [place]     Electrical Stimulation Parameters  to tolerance     Electrical Stimulation Goals  Pain      Ultrasound   Ultrasound Location  Rt lumbar paraspinals, Rt glute med and piriformis origin    Ultrasound Parameters  combo [place]- 100%, 1.3 w/cm2, 8 min     Ultrasound Goals  Pain tone      Traction   Min (lbs)  60    Max (lbs)  75    Hold Time  60    Rest Time  20    Time  19      Manual Therapy   Soft tissue mobilization  STM to Rt glute, pirirformis and lumbar paraspinals     Muscle Energy Technique  MET for Rt sacral torsion with contract relax of Rt hip rotators (limited tolerance)                   PT Long Term Goals - 05/23/17 1011      PT LONG TERM GOAL #1  Title  I with advanced HEP ( 05/12/17)    Time  4    Period  Weeks    Status  On-going      PT LONG TERM GOAL #2   Title  report no more than 1/10 Rt sided low back pain after a work shift ( 05/12/17)     Time  4    Period  Weeks    Status  On-going      PT LONG TERM GOAL #3   Title  demo full and painfree lumbar ROM ( 05/12/17)     Time  4    Period  Weeks    Status  On-going      PT LONG TERM GOAL #4   Title  improve bilat hamstring flexibility =/> 65 degrees ( 05/12/17)     Time  4    Period  Weeks    Status  On-going improved      PT LONG TERM GOAL #5   Title  improve FOTO =/< 34% limited ( 05/12/17)     Time  4    Period  Weeks    Status  On-going            Plan - 05/23/17 1008    Clinical Impression Statement  Pt reported slight relief by 2-3 points with combo [place] and traction, but pain returns once patient is standing or sitting.  Pain is a barrier to progress at this time; limited tolerance for exercise or functional activities.  No goals met at this time.  Pt returns to MD this morning.      Rehab Potential  Good    PT Frequency  2x / week    PT Duration  4 weeks    PT Treatment/Interventions  Dry needling;Manual techniques;Moist Heat;Ultrasound;Therapeutic  activities;Patient/family education;Therapeutic exercise;Cryotherapy;Electrical Stimulation;Passive range of motion;Traction    PT Next Visit Plan  Hold therapy and await advisement from MD.  continued therapy vs d/c.     Consulted and Agree with Plan of Care  Patient       Patient will benefit from skilled therapeutic intervention in order to improve the following deficits and impairments:  Improper body mechanics, Pain, Postural dysfunction, Increased muscle spasms, Hypomobility, Impaired flexibility  Visit Diagnosis: Acute right-sided low back pain without sciatica  Muscle spasm of back  Stiffness of right hip, not elsewhere classified     Problem List Patient Active Problem List   Diagnosis Date Noted  . Lumbago 04/21/2017   Kerin Perna, PTA 05/23/17 10:12 AM  [place], [place], [address] Phone: [phone]   Fax:  [phone]  Name: Scott Greene MRN: [id number] Date of Birth: [birth year]   PHYSICAL THERAPY DISCHARGE SUMMARY  Visits from Start of Care: 8 Current functional level related to goals / functional outcomes: No improvement   Remaining deficits: Same as on initial eval   Education / Equipment: HEP Plan: Patient agrees to discharge.  Patient goals were not met. Patient is being discharged due to lack of progress. Going to have injections and see pain management.  ?????    Jeral Pinch, PT 06/25/17 4:19 PM

## 2017-06-09 DIAGNOSIS — Z0189 Encounter for other specified special examinations: Secondary | ICD-10-CM

## 2017-06-18 ENCOUNTER — Telehealth: Payer: Self-pay

## 2017-06-18 NOTE — Telephone Encounter (Signed)
Scott Greene states 4 days this month he had to take 1 tablet twice daily of the Hydrocodone instead of once daily. He needs an early refill. Please advise.

## 2017-06-19 ENCOUNTER — Telehealth: Payer: Self-pay

## 2017-06-19 NOTE — Telephone Encounter (Signed)
Scott Greene states his appointment with Pain Management is not until 07/30/2017. He wants to have refills by Dr Denyse Amassorey until his appointment. Please advise.

## 2017-06-19 NOTE — Telephone Encounter (Signed)
Patient is calling repeatedly trying to get a refill on Hydrocodone medication. He stated that his appoint with pain management is March 31. I gave him advise from previous message  About contacting pain management but he is still insisiting. Please advise. Zulay Corrie,CMA

## 2017-06-19 NOTE — Telephone Encounter (Signed)
He needs to follow up with chronic pain management. Referral was placed.

## 2017-06-19 NOTE — Telephone Encounter (Signed)
I cannot continue to prescribe narcotics chronically. He will have to wait to try a different location.

## 2017-06-19 NOTE — Telephone Encounter (Signed)
PATIENT HAS BEEN ADVISED. Rhonda Cunningham,CMA  

## 2017-06-23 ENCOUNTER — Telehealth: Payer: Self-pay | Admitting: Family Medicine

## 2017-06-23 DIAGNOSIS — Z87891 Personal history of nicotine dependence: Secondary | ICD-10-CM | POA: Diagnosis not present

## 2017-06-23 DIAGNOSIS — M545 Low back pain: Secondary | ICD-10-CM | POA: Diagnosis not present

## 2017-06-23 DIAGNOSIS — G8929 Other chronic pain: Secondary | ICD-10-CM | POA: Diagnosis not present

## 2017-06-23 DIAGNOSIS — Z79899 Other long term (current) drug therapy: Secondary | ICD-10-CM | POA: Diagnosis not present

## 2017-06-23 NOTE — Telephone Encounter (Signed)
Patient called today at 4:10 pm advised he saw you 05/23/17 for lower back pain shooting down his leg pretty steady and you referred him to pain management but they can not get him in until March 13th and the pain is getting worse. Pt does not know what to do and pain management office adv pt he is till under your care until they can see him for him to call back. Pt is aware from speaking with different nurses that there is really nothing you can do for him but he is in pain and wants to know what he should do for this pain between now and March 13th. Thanks

## 2017-06-24 ENCOUNTER — Other Ambulatory Visit: Payer: Self-pay

## 2017-06-24 ENCOUNTER — Encounter: Payer: Self-pay | Admitting: *Deleted

## 2017-06-24 ENCOUNTER — Emergency Department (INDEPENDENT_AMBULATORY_CARE_PROVIDER_SITE_OTHER)
Admission: EM | Admit: 2017-06-24 | Discharge: 2017-06-24 | Disposition: A | Discharge status: Home / Self Care | Payer: BLUE CROSS/BLUE SHIELD | Source: Home / Self Care | Attending: Family Medicine | Admitting: Family Medicine

## 2017-06-24 DIAGNOSIS — G8929 Other chronic pain: Secondary | ICD-10-CM

## 2017-06-24 DIAGNOSIS — M545 Low back pain, unspecified: Secondary | ICD-10-CM

## 2017-06-24 MED ORDER — METHYLPREDNISOLONE ACETATE 80 MG/ML IJ SUSP: 80.0000 mg | Freq: Once | INTRAMUSCULAR | Status: DC

## 2017-06-24 MED ORDER — KETOROLAC TROMETHAMINE 60 MG/2ML IM SOLN
60.0000 mg | Freq: Once | INTRAMUSCULAR | Status: AC
  Administered 2017-06-24: 60 mg via INTRAMUSCULAR

## 2017-06-24 NOTE — Telephone Encounter (Signed)
I cannot do chronic pain management in this office. I can provide non-opiate medicines if we would like to try that but there isnt much a can do once we decide that it is chronic.

## 2017-06-24 NOTE — ED Triage Notes (Signed)
Pt c/o RT side LBP x 11/18', worse x 2 days. Denies new injury. Took Tylenol 2 tabs at 1100. He has seen Dr Denyse Amassorey in the past.

## 2017-06-24 NOTE — ED Notes (Signed)
Pt refused the depo medrol injection

## 2017-06-24 NOTE — ED Provider Notes (Signed)
[facility]    CSN: [id number] Arrival date & time: 06/24/17  1415     History   Chief Complaint Chief Complaint  Patient presents with  . Back Pain    HPI Ramond Craverhomas Fayette is a 27 y.o. male.   HPI Ramond Craverhomas Harrigan is a 27 y.o. male presenting to UC with c/o exacerbation of chronic Right low back pain that initially started after an injury in 03/2017.  Pain has worsened over the last 2 days.  Pain is aching and sharp on occasion, 9/10, worse with certain movements.  He believes it is due to him trying the exercises he was shown at PT and "overdoing it."  He took 2 tabs of Tylenol at 11AM.  He was being seen by Dr. Denyse Amassorey, Sports Medicine, but has since been referred to pain management. He cannot f/u with them until mid-March.  He has had spinal injections in December but denies relief from those injections.  Denies change in bowel or bladder habits. Denies fever or chills.  No new injuries. Denies weakness or numbness in his arms or legs.    History reviewed. No pertinent past medical history.  Patient Active Problem List   Diagnosis Date Noted  . Lumbago 04/21/2017    History reviewed. No pertinent surgical history.     Home Medications    Prior to Admission medications   Medication Sig Start Date End Date Taking? Authorizing Provider  acetaminophen (TYLENOL) 325 MG tablet Take 650 mg by mouth every 6 (six) hours as needed.   Yes [provider]  HYDROcodone-acetaminophen (NORCO/VICODIN) 5-325 MG tablet Take 1 tablet by mouth at bedtime as needed. 05/23/17   Rodolph Bongorey, Evan S, MD  methocarbamol (ROBAXIN) 500 MG tablet Take 1 tablet (500 mg total) 3 (three) times daily by mouth. 04/07/17   Rodolph Bongorey, Evan S, MD    Family History History reviewed. No pertinent family history.  Social History Social History   Tobacco Use  . Smoking status: Current Every Day Smoker  . Smokeless tobacco: Current User  Substance Use Topics  . Alcohol use: Yes  . Drug use: No      Allergies   Patient has no known allergies.   Review of Systems Review of Systems  Constitutional: Negative for chills and fever.  Genitourinary: Negative for dysuria, flank pain, frequency and hematuria.  Musculoskeletal: Positive for back pain and myalgias. Negative for neck pain and neck stiffness.  Neurological: Negative for weakness and numbness.     Physical Exam Triage Vital Signs ED Triage Vitals  Enc Vitals Group     BP 06/24/17 1544 128/80     Pulse Rate 06/24/17 1544 72     Resp 06/24/17 1544 16     Temp 06/24/17 1544 98 F (36.7 C)     Temp Source 06/24/17 1544 Oral     SpO2 06/24/17 1544 97 %     Weight 06/24/17 1545 187 lb (84.8 kg)     Height 06/24/17 1545 6\' 3"  (1.905 m)     Head Circumference --      Peak Flow --      Pain Score 06/24/17 1545 9     Pain Loc --      Pain Edu? --      Excl. in GC? --    No data found.  Updated Vital Signs BP 128/80 (BP Location: Right Arm)   Pulse 72   Temp 98 F (36.7 C) (Oral)   Resp 16   Ht  6\' 3"  (1.905 m)   Wt 187 lb (84.8 kg)   SpO2 97%   BMI 23.37 kg/m   Visual Acuity Right Eye Distance:   Left Eye Distance:   Bilateral Distance:    Right Eye Near:   Left Eye Near:    Bilateral Near:     Physical Exam  Constitutional: He is oriented to person, place, and time. He appears well-developed and well-nourished. No distress.  HENT:  Head: Normocephalic and atraumatic.  Eyes: EOM are normal.  Neck: Normal range of motion.  Cardiovascular: Normal rate.  Pulmonary/Chest: Effort normal.  Musculoskeletal: Normal range of motion. He exhibits tenderness. He exhibits no edema.  Tenderness to lower lumbar spine and Right lumbar muscles. Positive straight leg raise on the Right.  Full flexion and extension of both knees and ankles with 5/5 strength. Slight antalgic gait.  Neurological: He is alert and oriented to person, place, and time.  Reflex Scores:      Patellar reflexes are 2+ on the right side  and 2+ on the left side. Skin: Skin is warm and dry. No rash noted. He is not diaphoretic. No erythema.  Psychiatric: He has a normal mood and affect. His behavior is normal.  Nursing note and vitals reviewed.    UC Treatments / Results  Labs (all labs ordered are listed, but only abnormal results are displayed) Labs Reviewed - No data to display  EKG  EKG Interpretation None       Radiology No results found.  Procedures Procedures (including critical care time)  Medications Ordered in UC Medications  methylPREDNISolone acetate (DEPO-MEDROL) injection 80 mg (not administered)  ketorolac (TORADOL) injection 60 mg (60 mg Intramuscular Given 06/24/17 1631)     Initial Impression / Assessment and Plan / UC Course  I have reviewed the triage vital signs and the nursing notes.  Pertinent labs & imaging results that were available during my care of the patient were reviewed by me and considered in my medical decision making (see chart for details).     Reviewed PMH. Pt went to ED yesterday for same.  Pt advised by Dr. Denyse Amass, Sports Medicine, he would not be getting any additional narcotic pain medication from his practice, encouraged f/u with pain management as scheduled. Offered Toradol and Depo-medrol in UC. Pt agreed to Toradol but declined depo-medrol Home care instructions provided Resource guide for spinal specialists in [place] provided. Pt may be able to see them sooner than pain management for possible other treatments for his chronic back pain as he has not seen a neurosurgeon yet.   Final Clinical Impressions(s) / UC Diagnoses   Final diagnoses:  Acute exacerbation of chronic low back pain    ED Discharge Orders    None       Controlled Substance Prescriptions [place] Controlled Substance Registry consulted? Not Applicable   Rolla Plate 06/24/17 1642

## 2017-06-24 NOTE — Telephone Encounter (Signed)
Routing to Provider for review and recommendation.

## 2017-06-26 NOTE — Telephone Encounter (Signed)
Left VM to return clinic call.

## 2017-06-27 NOTE — Telephone Encounter (Signed)
Pt returned clinic call and was advised of Providers recommendation. He is OK to try whatever Provider will recommend.

## 2017-06-30 NOTE — Telephone Encounter (Signed)
Pt contacted clinic to advise Provider that his pain has not gotten any better, and he is having some numbness down his right leg. Questions what non-narcotic options Provider would recommend. Will route.

## 2017-06-30 NOTE — Telephone Encounter (Signed)
Gabapentin and amitrytpline. Does he want me to send these to the pharmacy?

## 2017-06-30 NOTE — Telephone Encounter (Signed)
Yes. Pt is using [facility] [phone] - [place], [place] - [address]

## 2017-07-01 MED ORDER — AMITRIPTYLINE HCL 25 MG PO TABS: 25.0000 mg | ORAL_TABLET | Freq: Every day | ORAL | 0 refills | Status: DC

## 2017-07-01 MED ORDER — GABAPENTIN 300 MG PO CAPS: ORAL_CAPSULE | ORAL | 3 refills | Status: DC

## 2017-07-01 NOTE — Telephone Encounter (Signed)
[facility]

## 2017-07-01 NOTE — Telephone Encounter (Signed)
Pt advised.

## 2017-07-30 DIAGNOSIS — M47816 Spondylosis without myelopathy or radiculopathy, lumbar region: Secondary | ICD-10-CM | POA: Diagnosis not present

## 2017-07-30 DIAGNOSIS — M461 Sacroiliitis, not elsewhere classified: Secondary | ICD-10-CM | POA: Diagnosis not present

## 2017-07-30 DIAGNOSIS — M791 Myalgia, unspecified site: Secondary | ICD-10-CM | POA: Diagnosis not present

## 2017-07-30 DIAGNOSIS — M5136 Other intervertebral disc degeneration, lumbar region: Secondary | ICD-10-CM | POA: Diagnosis not present

## 2017-07-30 DIAGNOSIS — Z79891 Long term (current) use of opiate analgesic: Secondary | ICD-10-CM | POA: Diagnosis not present

## 2017-07-30 DIAGNOSIS — G5701 Lesion of sciatic nerve, right lower limb: Secondary | ICD-10-CM | POA: Diagnosis not present

## 2017-08-12 DIAGNOSIS — M5136 Other intervertebral disc degeneration, lumbar region: Secondary | ICD-10-CM | POA: Diagnosis not present

## 2017-08-19 ENCOUNTER — Encounter: Payer: Self-pay | Admitting: Family Medicine

## 2017-08-19 ENCOUNTER — Ambulatory Visit (INDEPENDENT_AMBULATORY_CARE_PROVIDER_SITE_OTHER): Payer: BLUE CROSS/BLUE SHIELD | Admitting: Family Medicine

## 2017-08-19 VITALS — BP 148/91 | HR 93 | Wt 195.0 lb

## 2017-08-19 DIAGNOSIS — G8929 Other chronic pain: Secondary | ICD-10-CM | POA: Diagnosis not present

## 2017-08-19 DIAGNOSIS — G894 Chronic pain syndrome: Secondary | ICD-10-CM

## 2017-08-19 DIAGNOSIS — M461 Sacroiliitis, not elsewhere classified: Secondary | ICD-10-CM | POA: Insufficient documentation

## 2017-08-19 DIAGNOSIS — G5700 Lesion of sciatic nerve, unspecified lower limb: Secondary | ICD-10-CM | POA: Diagnosis not present

## 2017-08-19 DIAGNOSIS — M47817 Spondylosis without myelopathy or radiculopathy, lumbosacral region: Secondary | ICD-10-CM | POA: Diagnosis not present

## 2017-08-19 DIAGNOSIS — M5136 Other intervertebral disc degeneration, lumbar region: Secondary | ICD-10-CM | POA: Diagnosis not present

## 2017-08-19 DIAGNOSIS — M5441 Lumbago with sciatica, right side: Secondary | ICD-10-CM | POA: Diagnosis not present

## 2017-08-19 DIAGNOSIS — M51369 Other intervertebral disc degeneration, lumbar region without mention of lumbar back pain or lower extremity pain: Secondary | ICD-10-CM

## 2017-08-19 HISTORY — DX: Spondylosis without myelopathy or radiculopathy, lumbosacral region: M47.817

## 2017-08-19 HISTORY — DX: Sacroiliitis, not elsewhere classified: M46.1

## 2017-08-19 HISTORY — DX: Other intervertebral disc degeneration, lumbar region: M51.36

## 2017-08-19 HISTORY — DX: Chronic pain syndrome: G89.4

## 2017-08-19 HISTORY — DX: Other intervertebral disc degeneration, lumbar region without mention of lumbar back pain or lower extremity pain: M51.369

## 2017-08-19 NOTE — Patient Instructions (Signed)
Keep int ouch

## 2017-08-19 NOTE — Progress Notes (Signed)
Scott Greene is a 27 y.o. male who presents to [facility] today for follow up back pain. Scott Greene has been seen several times for low back pain following an injury at work. He failed to improve with typical conservative management.  MRI ultimately showed a relatively normal lumbar spine and facet injections did not provide adequate pain relief.  He was referred to chronic pain management where he has been receiving aquatic physical therapy and epidural steroid injections.  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  He notes his pain is worse with prolonged sitting or standing or pushing or pulling or lifting or bending he denies any bowel bladder dysfunction.   Past Medical History:  Diagnosis Date  . Chronic pain syndrome 08/19/2017  . Degeneration of lumbar intervertebral disc 08/19/2017  . Lumbosacral spondylosis without myelopathy 08/19/2017  . Sacroiliitis, not elsewhere classified (HCC) 08/19/2017   No past surgical history on file. Social History   Tobacco Use  . Smoking status: Current Every Day Smoker  . Smokeless tobacco: Current User  Substance Use Topics  . Alcohol use: Yes     ROS:  As above   Medications: Prescribed by Dr Celene SquibbGavins MEDICATIONS Reconcile with Patient's Chart  Medication Instructions Dosage Frequency Start Date End Date Duration Status  Hydrocodone-Acetaminophen 7.5-325 MG Orally every 8 hrs 1 tablet as needed 8h 26 Mar, 2019  7 DAYS Active  Mobic 15 MG Orally Once a day 1 tablet 24h 13 Mar, 2019  30 day(s) Active     Exam:  BP (!) 148/91   Pulse 93   Wt 195 lb (88.5 kg)   BMI 24.37 kg/m  General: Well Developed, well nourished, and in no acute distress.  Neuro/Psych: Alert and oriented x3, extra-ocular muscles intact, able to move all 4 extremities, sensation grossly intact. Skin: Warm and dry, no rashes noted.  Respiratory: Not using accessory  muscles, speaking in full sentences, trachea midline.  Cardiovascular: Pulses palpable, no extremity edema. Abdomen: Does not appear distended. MSK:  L-spine: Nontender to midline.  Tender palpation bilateral lumbar paraspinal muscle group. Lumbar range of motion extension 10 degrees flexion 20 degrees rotation 20 degrees bilaterally, lateral flexion 15 degrees bilaterally. Lower extremity strength is intact bilaterally. Reflexes are diminished but equal bilaterally. Sensation is intact throughout.  EXAM: LUMBAR SPINE - COMPLETE 4+ VIEW  COMPARISON:  None.  FINDINGS: There is no evidence of lumbar spine fracture. Alignment is normal. Intervertebral disc spaces are maintained.  IMPRESSION: Normal lumbar spine.   Electronically Signed   By: Lupita RaiderJames  Green Jr, M.D.   On: 04/07/2017 16:50 I personally (independently) visualized and performed the interpretation of the images attached in this note.   No results found for this or any previous visit (from the past 48 hour(s)). No results found.    Assessment and Plan: 27 y.o. male with chronic low back pain with some right-sided radiculopathy currently receiving care he had tried interventional pain management.  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  I do think there are some light duty he could do but apparently that is not available.  Continue current treatment with pain management and recheck in 6 months.  Return sooner if needed.  Work note provided today.    No orders of the defined types were placed in this encounter.  No orders of the defined types were placed in  this encounter.   Discussed warning signs or symptoms. Please see discharge instructions. Patient expresses understanding.  CC: [facility] [phone] [address] [place], [place] [address]      I spent 15 minutes with this patient, greater than 50% was face-to-face time  counseling regarding ddx and treatment plan.

## 2017-08-27 ENCOUNTER — Telehealth: Payer: Self-pay | Admitting: Family Medicine

## 2017-08-27 NOTE — Telephone Encounter (Signed)
Patient returned to work today on light duty and was only able to work for four hours. Pt stated that his employer noticed he was struggling and sent him home. Patient is asking that a letter requesting a leave of absence be faxed to his employer. Employer told patient that he would be using his own leave time until they received the updated letter from our office. If we could get this done for him asap, I'm sure he would appreciate it. If he needs to be seen, he is willing to come in for an appointment. Let me know when the letter is completed. I will fax it and notify and/or shedule the patient. Thanks!

## 2017-08-28 ENCOUNTER — Other Ambulatory Visit: Payer: Self-pay

## 2017-08-28 ENCOUNTER — Encounter: Payer: Self-pay | Admitting: Family Medicine

## 2017-08-28 DIAGNOSIS — G5701 Lesion of sciatic nerve, right lower limb: Secondary | ICD-10-CM | POA: Diagnosis not present

## 2017-08-28 DIAGNOSIS — M5136 Other intervertebral disc degeneration, lumbar region: Secondary | ICD-10-CM | POA: Diagnosis not present

## 2017-08-28 DIAGNOSIS — M47816 Spondylosis without myelopathy or radiculopathy, lumbar region: Secondary | ICD-10-CM | POA: Diagnosis not present

## 2017-08-28 DIAGNOSIS — M461 Sacroiliitis, not elsewhere classified: Secondary | ICD-10-CM | POA: Diagnosis not present

## 2017-08-28 NOTE — Telephone Encounter (Signed)
Out of work note provided.  I was not sure how long it needed to be and put 1 month.

## 2017-08-29 DIAGNOSIS — M47817 Spondylosis without myelopathy or radiculopathy, lumbosacral region: Secondary | ICD-10-CM | POA: Diagnosis not present

## 2017-08-29 DIAGNOSIS — G5701 Lesion of sciatic nerve, right lower limb: Secondary | ICD-10-CM | POA: Diagnosis not present

## 2017-08-29 DIAGNOSIS — M461 Sacroiliitis, not elsewhere classified: Secondary | ICD-10-CM | POA: Diagnosis not present

## 2017-09-04 ENCOUNTER — Telehealth: Payer: Self-pay | Admitting: *Deleted

## 2017-09-04 NOTE — Telephone Encounter (Signed)
Patient wants records to be sent with the forms once they are faxed

## 2017-09-11 ENCOUNTER — Ambulatory Visit: Payer: BLUE CROSS/BLUE SHIELD | Admitting: Rehabilitative and Restorative Service Providers"

## 2017-09-11 ENCOUNTER — Telehealth: Payer: Self-pay

## 2017-09-11 NOTE — Telephone Encounter (Signed)
Disability forms completed by Dr Denyse Amassorey and faxed to [facility] and [facility].   Phone: [phone] Fax: [phone]  Forms sent to be scanned to pt's chart with a copy of fax confirmation.

## 2017-09-22 DIAGNOSIS — M47816 Spondylosis without myelopathy or radiculopathy, lumbar region: Secondary | ICD-10-CM | POA: Diagnosis not present

## 2017-09-22 DIAGNOSIS — G8929 Other chronic pain: Secondary | ICD-10-CM | POA: Diagnosis not present

## 2017-09-22 DIAGNOSIS — M5416 Radiculopathy, lumbar region: Secondary | ICD-10-CM | POA: Diagnosis not present

## 2017-09-22 DIAGNOSIS — M5136 Other intervertebral disc degeneration, lumbar region: Secondary | ICD-10-CM | POA: Diagnosis not present

## 2017-09-22 DIAGNOSIS — G5701 Lesion of sciatic nerve, right lower limb: Secondary | ICD-10-CM | POA: Diagnosis not present

## 2017-09-22 DIAGNOSIS — M461 Sacroiliitis, not elsewhere classified: Secondary | ICD-10-CM | POA: Diagnosis not present

## 2017-09-22 DIAGNOSIS — M5441 Lumbago with sciatica, right side: Secondary | ICD-10-CM | POA: Diagnosis not present

## 2017-09-26 ENCOUNTER — Encounter: Payer: Self-pay | Admitting: Family Medicine

## 2017-09-26 ENCOUNTER — Ambulatory Visit (INDEPENDENT_AMBULATORY_CARE_PROVIDER_SITE_OTHER): Payer: BLUE CROSS/BLUE SHIELD | Admitting: Family Medicine

## 2017-09-26 VITALS — BP 130/73 | HR 87 | Ht 75.0 in | Wt 199.0 lb

## 2017-09-26 DIAGNOSIS — M5441 Lumbago with sciatica, right side: Secondary | ICD-10-CM | POA: Diagnosis not present

## 2017-09-26 DIAGNOSIS — G8929 Other chronic pain: Secondary | ICD-10-CM

## 2017-09-26 DIAGNOSIS — G894 Chronic pain syndrome: Secondary | ICD-10-CM

## 2017-09-26 NOTE — Progress Notes (Signed)
Lido Maske is a 27 y.o. male who presents to [facility] today for follow up back pain. Ghazi has been seen several times for low back pain following an injury at work. He failed to improve with typical conservative management.  MRI ultimately showed a relatively normal lumbar spine and facet injections did not provide adequate pain relief.  He was referred to chronic pain management where he has been receiving aquatic physical therapy and epidural steroid injections.  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  He notes his pain is worse with prolonged sitting or standing or pushing or pulling or lifting or bending he denies any bowel bladder dysfunction.   Past Medical History:  Diagnosis Date  . Chronic pain syndrome 08/19/2017  . Degeneration of lumbar intervertebral disc 08/19/2017  . Lumbosacral spondylosis without myelopathy 08/19/2017  . Sacroiliitis, not elsewhere classified (HCC) 08/19/2017   No past surgical history on file. Social History   Tobacco Use  . Smoking status: Current Every Day Smoker  . Smokeless tobacco: Current User  Substance Use Topics  . Alcohol use: Yes     ROS:  As above   Medications: Allergies as of 09/26/2017   No Known Allergies     Medication List        Accurate as of 09/26/17  9:33 AM. Always use your most recent med list.          ibuprofen 800 MG tablet Commonly known as:  ADVIL,MOTRIN 1 tablet with food or milk as needed   nortriptyline 50 MG capsule Commonly known as:  PAMELOR 1 to 2 capsules   oxyCODONE-acetaminophen 5-325 MG tablet Commonly known as:  PERCOCET/ROXICET 1 TABLET AS NEEDED EVERY 8 HRS ORALLY 7 DAYS   tiZANidine 4 MG tablet Commonly known as:  ZANAFLEX 1 tablet as needed        Exam:  BP 130/73   Pulse 87   Ht  (1.905 m)   Wt 199 lb (90.3 kg)   BMI 24.87 kg/m  General: Well Developed, well  nourished, and in no acute distress.  Neuro/Psych: Alert and oriented x3, extra-ocular muscles intact, able to move all 4 extremities, sensation grossly intact. Skin: Warm and dry, no rashes noted.  Respiratory: Not using accessory muscles, speaking in full sentences, trachea midline.  Cardiovascular: Pulses palpable, no extremity edema. Abdomen: Does not appear distended. MSK:  L-spine: Nontender to midline.  Tender palpation bilateral lumbar paraspinal muscle group. Lumbar range of motion extension 10 degrees flexion 20 degrees rotation 20 degrees bilaterally, lateral flexion 15 degrees bilaterally. Lower extremity strength is intact bilaterally. Reflexes are diminished but equal bilaterally. Sensation is intact throughout.  EXAM: LUMBAR SPINE - COMPLETE 4+ VIEW  COMPARISON:  None.  FINDINGS: There is no evidence of lumbar spine fracture. Alignment is normal. Intervertebral disc spaces are maintained.  IMPRESSION: Normal lumbar spine.   Electronically Signed   By: Lupita Raider, M.D.   On: 04/07/2017 16:50 I personally (independently) visualized and performed the interpretation of the images attached in this note.   No results found for this or any previous visit (from the past 48 hour(s)). No results found.    Assessment and Plan: 27 y.o. male with chronic low back pain with some right-sided radiculopathy currently receiving care he had tried interventional pain management.  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  I do think there are some light duty he could do but apparently that is not available.  Continue current treatment with pain management and recheck in 6 months.  Return sooner if needed.  Work note provided today.     No orders of the defined types were placed in this encounter.  No orders of the defined types were placed in this encounter.   Discussed warning signs or symptoms. Please see discharge instructions.  Patient expresses understanding.  CC: [facility] [phone] [address] [place], [place] [address]      I spent 15 minutes with this patient, greater than 50% was face-to-face time counseling regarding ddx and treatment plan.

## 2017-09-26 NOTE — Patient Instructions (Signed)
Thank you for coming in today. Continue current treatment.  Recheck before October 2nd.  Let me know if things change.  I will keep everyone updates.

## 2017-10-14 DIAGNOSIS — M5136 Other intervertebral disc degeneration, lumbar region: Secondary | ICD-10-CM | POA: Diagnosis not present

## 2017-10-24 ENCOUNTER — Ambulatory Visit: Payer: BLUE CROSS/BLUE SHIELD | Admitting: Family Medicine

## 2017-10-29 DIAGNOSIS — M5441 Lumbago with sciatica, right side: Secondary | ICD-10-CM | POA: Diagnosis not present

## 2017-10-29 DIAGNOSIS — M5416 Radiculopathy, lumbar region: Secondary | ICD-10-CM | POA: Diagnosis not present

## 2017-10-29 DIAGNOSIS — M5136 Other intervertebral disc degeneration, lumbar region: Secondary | ICD-10-CM | POA: Diagnosis not present

## 2017-10-29 DIAGNOSIS — G8929 Other chronic pain: Secondary | ICD-10-CM | POA: Diagnosis not present

## 2017-11-05 ENCOUNTER — Ambulatory Visit (INDEPENDENT_AMBULATORY_CARE_PROVIDER_SITE_OTHER): Payer: BLUE CROSS/BLUE SHIELD | Admitting: Family Medicine

## 2017-11-05 ENCOUNTER — Encounter: Payer: Self-pay | Admitting: Family Medicine

## 2017-11-05 VITALS — BP 117/69 | HR 71 | Ht 75.0 in | Wt 210.0 lb

## 2017-11-05 DIAGNOSIS — G8929 Other chronic pain: Secondary | ICD-10-CM | POA: Diagnosis not present

## 2017-11-05 DIAGNOSIS — M5441 Lumbago with sciatica, right side: Secondary | ICD-10-CM

## 2017-11-05 NOTE — Patient Instructions (Signed)
Thank you for coming in today. Recheck as needed before November 12.  I will fill form as needed.  Make sure your other doctors continue to send notes.

## 2017-11-05 NOTE — Progress Notes (Signed)
   Scott Greene is a 27 y.o. male who presents to [facility] today for follow up back pain. Scott Greene has been seen several times for low back pain following an injury at work. He failed to improve with typical conservative management.  MRI ultimately showed a relatively normal lumbar spine and facet injections did not provide adequate pain relief.  He was referred to chronic pain management where he has been receiving aquatic physical therapy and epidural steroid injections.  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  He notes his pain is worse with prolonged sitting or standing or pushing or pulling or lifting or bending he denies any bowel bladder dysfunction. Historical information not changed from previous visit.    He is scheduled for CT myelogram in the near future.  Pain persists and not significantly changed. No significant improvement since last visit  Medical surgical and social history reviewed.     Exam:  BP 117/69   Pulse 71   Ht 6\' 3"  (1.905 m)   Wt 210 lb (95.3 kg)   BMI 26.25 kg/m  General: Non-toxic appearing  MSK:  L-spine: Nontender to midline.  Tender palpation bilateral lumbar paraspinal muscle group. Lumbar range of motion extension 10 degrees flexion 20 degrees rotation 20 degrees bilaterally, lateral flexion 15 degrees bilaterally. Lower extremity strength is intact bilaterally. Sensation is intact throughout.  Exam not changed from previous visit     Assessment and Plan: 27 y.o. male with chronic low back pain with some right-sided radiculopathy currently receiving care he had tried interventional pain management.  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  Plan to continue management with pain management.  Lengthy discussion about expected course and outcome.  Form filled out today as well.  Recheck with me as needed.  I spent 15  minutes with this patient, greater than 50% was face-to-face time counseling regarding DDX and treatment plan.      Discussed warning signs or symptoms. Please see discharge instructions. Patient expresses understanding.  CC: [facility] Burtis JunesGarvin Tarsha [phone] [address] [place], [place] [id number]

## 2017-12-02 DIAGNOSIS — S3992XA Unspecified injury of lower back, initial encounter: Secondary | ICD-10-CM | POA: Diagnosis not present

## 2017-12-02 DIAGNOSIS — M545 Low back pain: Secondary | ICD-10-CM | POA: Diagnosis not present

## 2017-12-02 DIAGNOSIS — M5116 Intervertebral disc disorders with radiculopathy, lumbar region: Secondary | ICD-10-CM | POA: Diagnosis not present

## 2017-12-02 DIAGNOSIS — M5127 Other intervertebral disc displacement, lumbosacral region: Secondary | ICD-10-CM | POA: Diagnosis not present

## 2017-12-02 DIAGNOSIS — M5117 Intervertebral disc disorders with radiculopathy, lumbosacral region: Secondary | ICD-10-CM | POA: Diagnosis not present

## 2017-12-02 DIAGNOSIS — M4725 Other spondylosis with radiculopathy, thoracolumbar region: Secondary | ICD-10-CM | POA: Diagnosis not present

## 2017-12-02 DIAGNOSIS — M5416 Radiculopathy, lumbar region: Secondary | ICD-10-CM | POA: Diagnosis not present

## 2017-12-02 DIAGNOSIS — R937 Abnormal findings on diagnostic imaging of other parts of musculoskeletal system: Secondary | ICD-10-CM | POA: Diagnosis not present

## 2017-12-22 DIAGNOSIS — G5701 Lesion of sciatic nerve, right lower limb: Secondary | ICD-10-CM | POA: Diagnosis not present

## 2017-12-22 DIAGNOSIS — M791 Myalgia, unspecified site: Secondary | ICD-10-CM | POA: Diagnosis not present

## 2017-12-22 DIAGNOSIS — M5136 Other intervertebral disc degeneration, lumbar region: Secondary | ICD-10-CM | POA: Diagnosis not present

## 2017-12-22 DIAGNOSIS — M47816 Spondylosis without myelopathy or radiculopathy, lumbar region: Secondary | ICD-10-CM | POA: Diagnosis not present

## 2017-12-22 DIAGNOSIS — Z79891 Long term (current) use of opiate analgesic: Secondary | ICD-10-CM | POA: Diagnosis not present

## 2017-12-22 DIAGNOSIS — M461 Sacroiliitis, not elsewhere classified: Secondary | ICD-10-CM | POA: Diagnosis not present

## 2017-12-23 ENCOUNTER — Telehealth: Payer: Self-pay

## 2017-12-23 NOTE — Telephone Encounter (Addendum)
Scott Greene called and states the paperwork needs to be faxed to [phone]. Please call patient when finished.

## 2017-12-23 NOTE — Telephone Encounter (Signed)
I have no idea what paper work we are talking about and it is not in my basket and I have not received anything to fax regarding this patient  so I dont know what to do on this one.. Thanks. Kipp Shank,CMA

## 2017-12-24 NOTE — Telephone Encounter (Signed)
Faxed to corrected fax number.

## 2018-01-13 DIAGNOSIS — M5136 Other intervertebral disc degeneration, lumbar region: Secondary | ICD-10-CM | POA: Diagnosis not present

## 2018-01-29 ENCOUNTER — Ambulatory Visit: Payer: BLUE CROSS/BLUE SHIELD | Admitting: Family Medicine

## 2018-01-29 DIAGNOSIS — Z79899 Other long term (current) drug therapy: Secondary | ICD-10-CM | POA: Diagnosis not present

## 2018-01-29 DIAGNOSIS — G8929 Other chronic pain: Secondary | ICD-10-CM | POA: Diagnosis not present

## 2018-01-29 DIAGNOSIS — Z87891 Personal history of nicotine dependence: Secondary | ICD-10-CM | POA: Diagnosis not present

## 2018-01-29 DIAGNOSIS — M545 Low back pain: Secondary | ICD-10-CM | POA: Diagnosis not present

## 2018-01-29 DIAGNOSIS — M5137 Other intervertebral disc degeneration, lumbosacral region: Secondary | ICD-10-CM | POA: Diagnosis not present

## 2018-01-29 DIAGNOSIS — M4316 Spondylolisthesis, lumbar region: Secondary | ICD-10-CM | POA: Diagnosis not present

## 2018-01-30 ENCOUNTER — Ambulatory Visit (INDEPENDENT_AMBULATORY_CARE_PROVIDER_SITE_OTHER): Payer: BLUE CROSS/BLUE SHIELD | Admitting: Family Medicine

## 2018-01-30 ENCOUNTER — Encounter: Payer: Self-pay | Admitting: Family Medicine

## 2018-01-30 VITALS — BP 153/73 | HR 74 | Ht 74.0 in | Wt 210.0 lb

## 2018-01-30 DIAGNOSIS — S39012A Strain of muscle, fascia and tendon of lower back, initial encounter: Secondary | ICD-10-CM

## 2018-01-30 NOTE — Patient Instructions (Addendum)
Thank you for coming in today. Continue ibuprofen  Continue SOMA at beftime Use heating pad and TENS unit.
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
## 2018-02-06 ENCOUNTER — Ambulatory Visit (INDEPENDENT_AMBULATORY_CARE_PROVIDER_SITE_OTHER): Payer: BLUE CROSS/BLUE SHIELD | Admitting: Family Medicine

## 2018-02-06 ENCOUNTER — Encounter: Payer: Self-pay | Admitting: Family Medicine

## 2018-02-06 VITALS — BP 119/84 | HR 72 | Ht 74.0 in | Wt 211.0 lb

## 2018-02-06 DIAGNOSIS — M5136 Other intervertebral disc degeneration, lumbar region: Secondary | ICD-10-CM

## 2018-02-06 DIAGNOSIS — M461 Sacroiliitis, not elsewhere classified: Secondary | ICD-10-CM | POA: Diagnosis not present

## 2018-02-06 DIAGNOSIS — M47817 Spondylosis without myelopathy or radiculopathy, lumbosacral region: Secondary | ICD-10-CM | POA: Diagnosis not present

## 2018-02-06 NOTE — Patient Instructions (Signed)
Thank you for coming in today. Attend PT.  Let me know how it goes.  Recheck in 6 months.  Return sooner if needed.

## 2018-02-06 NOTE — Progress Notes (Signed)
Scott Greene is a 27 y.o. male who presents to [facility] today for chronic low back pain.  Scott Greene has been seen multiple times for his chronic low back pain starting November 2018 with a workplace lifting injury.  He notes continued chronic low back pain.  He is been seen by pain management Dr. Oneal Grout and received multiple injections with some benefit.  Additionally he initially had a trial physical therapy which did not work but he would like to retry physical therapy if possible.  He notes occasional pain radiating down his right leg but denies any current numbness or weakness distally.  He notes significant back pain and stiffness.  He has significant difficulty at home with normal activities of daily living such as dressing himself for example putting socks on as well as bathing and light housework.  He notes minimal lifting or bending is quite painful.  Additionally he notes persistent pain that is quite bothersome with prolonged standing or prolonged sitting.  He takes Herbalist and ibuprofen for pain which helps a little.  He is in the process of being referred to a neurosurgeon for evaluation surgery.    ROS:  As above  Exam:  BP 119/84   Pulse 72   Ht 6\' 2"  (1.88 m)   Wt 211 lb (95.7 kg)   BMI 27.09 kg/m  General: Well Developed, well nourished, and in no acute distress.  Neuro/Psych: Alert and oriented x3, extra-ocular muscles intact, able to move all 4 extremities, sensation grossly intact. Skin: Warm and dry, no rashes noted.  Respiratory: Not using accessory muscles, speaking in full sentences, trachea midline.  Cardiovascular: Pulses palpable, no extremity edema. Abdomen: Does not appear distended. MSK:   L-spine: Nontender to spinal midline.  Tender palpation lumbar paraspinal muscle groups. Significantly limited lumbar motion. Lower extremity strength is intact. Antalgic gait.      Assessment and Plan: 27 y.o. male with    Chronic low back pain.  Comanagement with Dr. Oneal Grout pain management.  Plan to proceed with repeat trial of physical therapy.  Agree with surgery opinion referral.  Continue injections as tolerated or is improving.  Patient is still not able to return to work.  He has significant limitations with activities of daily living and is not able to return to work currently.  Plan to reassess and 6 months or sooner if needed. [employer] claim management form filled out and will be faxed today.   I spent 15 minutes with this patient, greater than 50% was face-to-face time counseling regarding ddx and plan.  Orders Placed This Encounter  Procedures  . Ambulatory referral to Physical Therapy    Referral Priority:   Routine    Referral Type:   Physical Medicine    Referral Reason:   Specialty Services Required    Requested Specialty:   Physical Therapy   No orders of the defined types were placed in this encounter.   Historical information moved to improve visibility of documentation.  Past Medical History:  Diagnosis Date  . Chronic pain syndrome 08/19/2017  . Degeneration of lumbar intervertebral disc 08/19/2017  . Lumbosacral spondylosis without myelopathy 08/19/2017  . Sacroiliitis, not elsewhere classified (HCC) 08/19/2017   No past surgical history on file. Social History   Tobacco Use  . Smoking status: Current Every Day Smoker  . Smokeless tobacco: Current User  Substance Use Topics  . Alcohol use: Yes   family history is not on file.  Medications: Current Outpatient  Medications  Medication Sig Dispense Refill  . carisoprodol (SOMA) 350 MG tablet Take by mouth.    Marland Kitchen. ibuprofen (ADVIL,MOTRIN) 800 MG tablet 1 tablet with food or milk as needed     No current facility-administered medications for this visit.    No Known Allergies    Discussed warning signs or symptoms. Please see discharge instructions. Patient expresses understanding.

## 2018-02-10 ENCOUNTER — Ambulatory Visit: Payer: BLUE CROSS/BLUE SHIELD | Admitting: Physical Therapy

## 2018-02-17 DIAGNOSIS — M5136 Other intervertebral disc degeneration, lumbar region: Secondary | ICD-10-CM | POA: Diagnosis not present

## 2018-02-17 DIAGNOSIS — M545 Low back pain: Secondary | ICD-10-CM | POA: Diagnosis not present

## 2018-02-17 DIAGNOSIS — M5416 Radiculopathy, lumbar region: Secondary | ICD-10-CM | POA: Diagnosis not present

## 2018-02-17 DIAGNOSIS — S3992XA Unspecified injury of lower back, initial encounter: Secondary | ICD-10-CM | POA: Diagnosis not present

## 2018-03-02 DIAGNOSIS — G5701 Lesion of sciatic nerve, right lower limb: Secondary | ICD-10-CM | POA: Diagnosis not present

## 2018-03-02 DIAGNOSIS — M5136 Other intervertebral disc degeneration, lumbar region: Secondary | ICD-10-CM | POA: Diagnosis not present

## 2018-03-02 DIAGNOSIS — Z79891 Long term (current) use of opiate analgesic: Secondary | ICD-10-CM | POA: Diagnosis not present

## 2018-03-02 DIAGNOSIS — M47816 Spondylosis without myelopathy or radiculopathy, lumbar region: Secondary | ICD-10-CM | POA: Diagnosis not present

## 2018-03-02 DIAGNOSIS — M791 Myalgia, unspecified site: Secondary | ICD-10-CM | POA: Diagnosis not present

## 2018-03-02 DIAGNOSIS — M461 Sacroiliitis, not elsewhere classified: Secondary | ICD-10-CM | POA: Diagnosis not present

## 2018-04-14 DIAGNOSIS — M5136 Other intervertebral disc degeneration, lumbar region: Secondary | ICD-10-CM | POA: Diagnosis not present

## 2018-05-25 DIAGNOSIS — M5136 Other intervertebral disc degeneration, lumbar region: Secondary | ICD-10-CM | POA: Diagnosis not present

## 2018-05-25 DIAGNOSIS — G5701 Lesion of sciatic nerve, right lower limb: Secondary | ICD-10-CM | POA: Diagnosis not present

## 2018-05-25 DIAGNOSIS — M461 Sacroiliitis, not elsewhere classified: Secondary | ICD-10-CM | POA: Diagnosis not present

## 2018-05-25 DIAGNOSIS — M47816 Spondylosis without myelopathy or radiculopathy, lumbar region: Secondary | ICD-10-CM | POA: Diagnosis not present

## 2018-05-26 DIAGNOSIS — M5136 Other intervertebral disc degeneration, lumbar region: Secondary | ICD-10-CM | POA: Diagnosis not present

## 2018-07-17 ENCOUNTER — Ambulatory Visit: Payer: BLUE CROSS/BLUE SHIELD | Admitting: Family Medicine

## 2018-07-17 NOTE — Progress Notes (Deleted)
   Scott Greene is a 28 y.o. male who presents to [facility] today for follow-up low back pain.Scott Greene seen multiple times starting November 2018 for low back pain resulting from a workplace injury due to lifting.  He had some improvement with physical therapy home exercise program and subsequent low back injections with pain management with Dr. Oneal Grout.  However as of his last visit September 2018 he continued to have quite a bit of pain that was limiting his ability to work as well as complete normal household activities such as light housework bathing and dressing.  Additionally had significant difficulty with exercise.  He saw neurosurgeon for initial consultation in October 2019.  Based on evaluation of the records surgery was not recommended.  ***    ROS:  As above  Exam:  There were no vitals taken for this visit. Wt Readings from Last 5 Encounters:  02/06/18 211 lb (95.7 kg)  01/30/18 210 lb (95.3 kg)  11/05/17 210 lb (95.3 kg)  09/26/17 199 lb (90.3 kg)  08/19/17 195 lb (88.5 kg)   General: Well Developed, well nourished, and in no acute distress.  Neuro/Psych: Alert and oriented x3, extra-ocular muscles intact, able to move all 4 extremities, sensation grossly intact. Skin: Warm and dry, no rashes noted.  Respiratory: Not using accessory muscles, speaking in full sentences, trachea midline.  Cardiovascular: Pulses palpable, no extremity edema. Abdomen: Does not appear distended. MSK: ***    Lab and Radiology Results No results found for this or any previous visit (from the past 72 hour(s)). No results found.     Assessment and Plan: 28 y.o. male with ***   PDMP not reviewed this encounter. No orders of the defined types were placed in this encounter.  No orders of the defined types were placed in this encounter.   Historical information moved to improve visibility of documentation.  Past Medical History:  Diagnosis  Date  . Chronic pain syndrome 08/19/2017  . Degeneration of lumbar intervertebral disc 08/19/2017  . Lumbosacral spondylosis without myelopathy 08/19/2017  . Sacroiliitis, not elsewhere classified (HCC) 08/19/2017   No past surgical history on file. Social History   Tobacco Use  . Smoking status: Current Every Day Smoker  . Smokeless tobacco: Current User  Substance Use Topics  . Alcohol use: Yes   family history is not on file.  Medications: Current Outpatient Medications  Medication Sig Dispense Refill  . ibuprofen (ADVIL,MOTRIN) 800 MG tablet 1 tablet with food or milk as needed     No current facility-administered medications for this visit.    No Known Allergies    Discussed warning signs or symptoms. Please see discharge instructions. Patient expresses understanding.

## 2018-08-26 ENCOUNTER — Ambulatory Visit: Payer: Self-pay | Admitting: Family Medicine

## 2018-09-01 ENCOUNTER — Ambulatory Visit: Payer: Self-pay | Admitting: Family Medicine

## 2018-10-16 ENCOUNTER — Encounter: Payer: Self-pay | Admitting: Family Medicine

## 2018-10-16 ENCOUNTER — Ambulatory Visit (INDEPENDENT_AMBULATORY_CARE_PROVIDER_SITE_OTHER): Payer: Self-pay | Admitting: Family Medicine

## 2018-10-16 VITALS — BP 117/66 | HR 69 | Temp 97.7°F | Wt 202.0 lb

## 2018-10-16 DIAGNOSIS — M47817 Spondylosis without myelopathy or radiculopathy, lumbosacral region: Secondary | ICD-10-CM

## 2018-10-16 DIAGNOSIS — M461 Sacroiliitis, not elsewhere classified: Secondary | ICD-10-CM

## 2018-10-16 DIAGNOSIS — M5136 Other intervertebral disc degeneration, lumbar region: Secondary | ICD-10-CM

## 2018-10-16 NOTE — Progress Notes (Signed)
   Scott Greene is a 28 y.o. male who presents to [facility] today for back pain. Patient continues to have significant back pain and sciatica.  He continues his management with Dr. Oneal Grout at [facility].  His last visit with me was September 20.  That time he had significant pain that was not improving and had been unable to return to work.  He notes his symptoms are not much change.  He has significant difficulty with bending pushing and pulling.  He has difficulty completing tasks at home such as cleaning and cooking folding laundry etc.   ROS:  As above  Exam:  BP 117/66   Pulse 69   Temp 97.7 F (36.5 C) (Oral)   Wt 202 lb (91.6 kg)   BMI 25.94 kg/m  Wt Readings from Last 5 Encounters:  10/16/18 202 lb (91.6 kg)  02/06/18 211 lb (95.7 kg)  01/30/18 210 lb (95.3 kg)  11/05/17 210 lb (95.3 kg)  09/26/17 199 lb (90.3 kg)   General: Well Developed, well nourished, and in no acute distress.  Neuro/Psych: Alert and oriented x3, extra-ocular muscles intact, able to move all 4 extremities, sensation grossly intact. Skin: Warm and dry, no rashes noted.  Respiratory: Not using accessory muscles, speaking in full sentences, trachea midline.  Cardiovascular: Pulses palpable, no extremity edema. Abdomen: Does not appear distended. MSK: L-spine: Nontender to spinal midline.  Significant decreased lumbar motion.  Antalgic gait present.       Assessment and Plan: 28 y.o. male with continued low back pain.  Patient remains disabled.  Continue comanagement with Dr. Oneal Grout.  Completed disability form.  Recheck in 1 year or sooner if needed.   PDMP not reviewed this encounter. No orders of the defined types were placed in this encounter.  No orders of the defined types were placed in this encounter.   Historical information moved to improve visibility of documentation.  Past Medical History:  Diagnosis Date  .  Chronic pain syndrome 08/19/2017  . Degeneration of lumbar intervertebral disc 08/19/2017  . Lumbosacral spondylosis without myelopathy 08/19/2017  . Sacroiliitis, not elsewhere classified (HCC) 08/19/2017   No past surgical history on file. Social History   Tobacco Use  . Smoking status: Current Every Day Smoker  . Smokeless tobacco: Current User  Substance Use Topics  . Alcohol use: Yes   family history is not on file.  Medications: Current Outpatient Medications  Medication Sig Dispense Refill  . HYDROcodone-acetaminophen (NORCO) 7.5-325 MG tablet Take 1 tablet by mouth every 12 (twelve) hours as needed.    Marland Kitchen ibuprofen (ADVIL,MOTRIN) 800 MG tablet 1 tablet with food or milk as needed     No current facility-administered medications for this visit.    No Known Allergies    Discussed warning signs or symptoms. Please see discharge instructions. Patient expresses understanding.

## 2018-10-16 NOTE — Patient Instructions (Addendum)
Thank you for coming in today. Let me know if you have any issues with the forms.  Continue with Dr Oneal Grout.  See me as needed.   Recheck in 1 year or as needed by forms

## 2019-06-18 ENCOUNTER — Emergency Department (INDEPENDENT_AMBULATORY_CARE_PROVIDER_SITE_OTHER)
Admission: EM | Admit: 2019-06-18 | Discharge: 2019-06-18 | Disposition: A | Discharge status: Home / Self Care | Payer: Self-pay | Source: Home / Self Care | Attending: Family Medicine | Admitting: Family Medicine

## 2019-06-18 ENCOUNTER — Other Ambulatory Visit: Payer: Self-pay

## 2019-06-18 DIAGNOSIS — R195 Other fecal abnormalities: Secondary | ICD-10-CM

## 2019-06-18 LAB — POCT CBC W AUTO DIFF (K'VILLE URGENT CARE)

## 2019-06-18 LAB — POC HEMOCCULT BLD/STL (OFFICE/1-CARD/DIAGNOSTIC): Card #1 Date: POSITIVE

## 2019-06-18 NOTE — ED Triage Notes (Signed)
Pt noticed dark stools around 7 pm last night.  Has been having abd pain for 24 hours.  Has been dizzy for about 2 days.

## 2019-06-18 NOTE — Discharge Instructions (Addendum)
If symptoms become significantly worse during the night or over the weekend, proceed to the local emergency room.  

## 2019-06-18 NOTE — ED Provider Notes (Signed)
[facility]    CSN: [id number] Arrival date & time: 06/18/19  1254      History   Chief Complaint Chief Complaint  Patient presents with  . Abdominal Pain    HPI Scott Greene is a 29 y.o. male.   Patient states that he has had loose stools and been intermittently light-headed for about two days.  During the past 24 hours he has had dull/aching lower abdominal pain.  Last night he noticed very dark stools but has not seen blood. He denies history of hemorrhoids.  He denies nausea/vomiting, and no fevers, chills, and sweats.  The history is provided by the patient.    Past Medical History:  Diagnosis Date  . Chronic pain syndrome 08/19/2017  . Degeneration of lumbar intervertebral disc 08/19/2017  . Lumbosacral spondylosis without myelopathy 08/19/2017  . Sacroiliitis, not elsewhere classified (HCC) 08/19/2017    Patient Active Problem List   Diagnosis Date Noted  . Chronic pain syndrome 08/19/2017  . Degeneration of lumbar intervertebral disc 08/19/2017  . Lumbosacral spondylosis without myelopathy 08/19/2017  . Sacroiliitis, not elsewhere classified (HCC) 08/19/2017  . 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 Lumbago 04/21/2017    History reviewed. No pertinent surgical history.     Home Medications    Prior to Admission medications   Medication Sig Start Date End Date Taking? Authorizing Provider  HYDROcodone-acetaminophen (NORCO) 7.5-325 MG tablet Take 1 tablet by mouth every 12 (twelve) hours as needed. 08/19/18   [provider]  ibuprofen (ADVIL,MOTRIN) 800 MG tablet 1 tablet with food or milk as needed 09/22/17   [provider]    Family History History reviewed. No pertinent family history.  Social History Social History   Tobacco Use  . Smoking status: Current Every Day Smoker  . Smokeless tobacco: Current User  Substance Use Topics  . Alcohol use: Yes  . Drug use: No     Allergies   Patient has no known  allergies.   Review of Systems Review of Systems  Constitutional: Positive for activity change, appetite change and fatigue. Negative for chills, diaphoresis and fever.  HENT: Negative.   Eyes: Negative.   Respiratory: Negative.   Cardiovascular: Negative.   Gastrointestinal: Positive for abdominal pain and diarrhea. Negative for abdominal distention, anal bleeding, blood in stool, constipation, nausea, rectal pain and vomiting.  Genitourinary: Negative.   Musculoskeletal: Negative.   Skin: Negative.   Neurological: Positive for light-headedness. Negative for headaches.     Physical Exam Triage Vital Signs ED Triage Vitals  Enc Vitals Group     BP 06/18/19 1328 137/82     Pulse Rate 06/18/19 1328 (!) 58     Resp 06/18/19 1328 20     Temp 06/18/19 1328 98.2 F (36.8 C)     Temp Source 06/18/19 1328 Oral     SpO2 06/18/19 1328 99 %     Weight 06/18/19 1329 183 lb (83 kg)     Height 06/18/19 1329 6\' 2"  (1.88 m)     Head Circumference --      Peak Flow --      Pain Score --      Pain Loc --      Pain Edu? --      Excl. in GC? --    Orthostatic VS for the past 24 hrs:  BP- Lying Pulse- Lying BP- Sitting Pulse- Sitting BP- Standing at 0 minutes Pulse- Standing at 0 minutes  06/18/19 1456 109/71 58 119/78 69 106/58  77    Updated Vital Signs BP 137/82 (BP Location: Right Arm)   Pulse (!) 58   Temp 98.2 F (36.8 C) (Oral)   Resp 20   Ht 6\' 2"  (1.88 m)   Wt 83 kg   SpO2 99%   BMI 23.50 kg/m   Visual Acuity Right Eye Distance:   Left Eye Distance:   Bilateral Distance:    Right Eye Near:   Left Eye Near:    Bilateral Near:     Physical Exam Vitals and nursing note reviewed.  Constitutional:      General: He is not in acute distress. HENT:     Head: Normocephalic.     Right Ear: External ear normal.     Left Ear: External ear normal.     Nose: Nose normal.     Mouth/Throat:     Pharynx: Oropharynx is clear.  Eyes:     Conjunctiva/sclera: Conjunctivae  normal.     Pupils: Pupils are equal, round, and reactive to light.  Cardiovascular:     Rate and Rhythm: Regular rhythm. Bradycardia present.     Heart sounds: Normal heart sounds.  Pulmonary:     Breath sounds: Normal breath sounds.  Abdominal:     General: Bowel sounds are increased.     Palpations: Abdomen is soft. There is no hepatomegaly, splenomegaly or mass.     Tenderness: There is abdominal tenderness in the right lower quadrant and left lower quadrant. There is no right CVA tenderness or left CVA tenderness. Negative signs include McBurney's sign.       Comments: Rectal Exam:  Anus is normal without surrounding erythema.  No external hemorrhoids are present.  No lesions are palpated in the rectal vault.  Stool is heme positive.  Prostate is nontender.                           Musculoskeletal:     Cervical back: Neck supple.     Right lower leg: No edema.     Left lower leg: No edema.  Lymphadenopathy:     Cervical: No cervical adenopathy.  Skin:    General: Skin is warm and dry.     Findings: No rash.  Neurological:     Mental Status: He is alert.      UC Treatments / Results  Labs (all labs ordered are listed, but only abnormal results are displayed) Labs Reviewed  POCT CBC W AUTO DIFF ([facility]):  WBC 6.3; LY 14.1; MO 9.9; GR 76.0; Hgb 14.7; Platelets 337   POC HEMOCCULT BLD/STL (OFFICE/1-CARD/DIAGNOSTIC):  Positive    EKG   Radiology No results found.  Procedures Procedures (including critical care time)  Medications Ordered in UC Medications - No data to display  Initial Impression / Assessment and Plan / UC Course  I have reviewed the triage vital signs and the nursing notes.  Pertinent labs & imaging results that were available during my care of the patient were reviewed by me and considered in my medical decision making (see chart for details).    Hemoccult positive stool.  Normal CBC reassuring. Will refer to GI for further  evaluation.  Final Clinical Impressions(s) / UC Diagnoses   Final diagnoses:  Heme positive stool     Discharge Instructions     If symptoms become significantly worse during the night or over the weekend, proceed to the local emergency room.     ED  Prescriptions    None        Lattie Haw, MD 06/23/19 [phone]

## 2019-10-07 IMAGING — DX DG LUMBAR SPINE COMPLETE 4+V
5 series · 5 of 5 positions shown · non-contrast
Comparison: None.

CLINICAL DATA: Low back pain with right lower extremity numbness
for 2 weeks.

EXAM:
LUMBAR SPINE - COMPLETE 4+ VIEW

[l-spine ap]
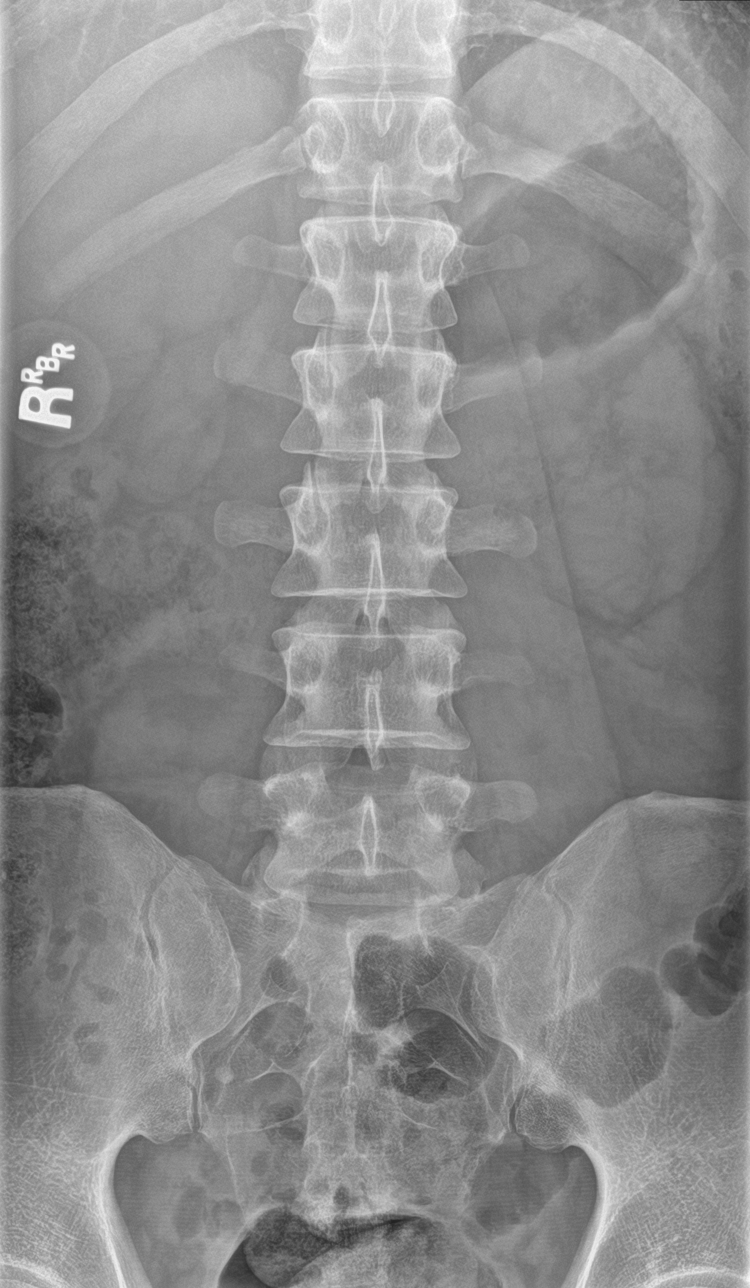

[l-spine obl (1 of 2)]
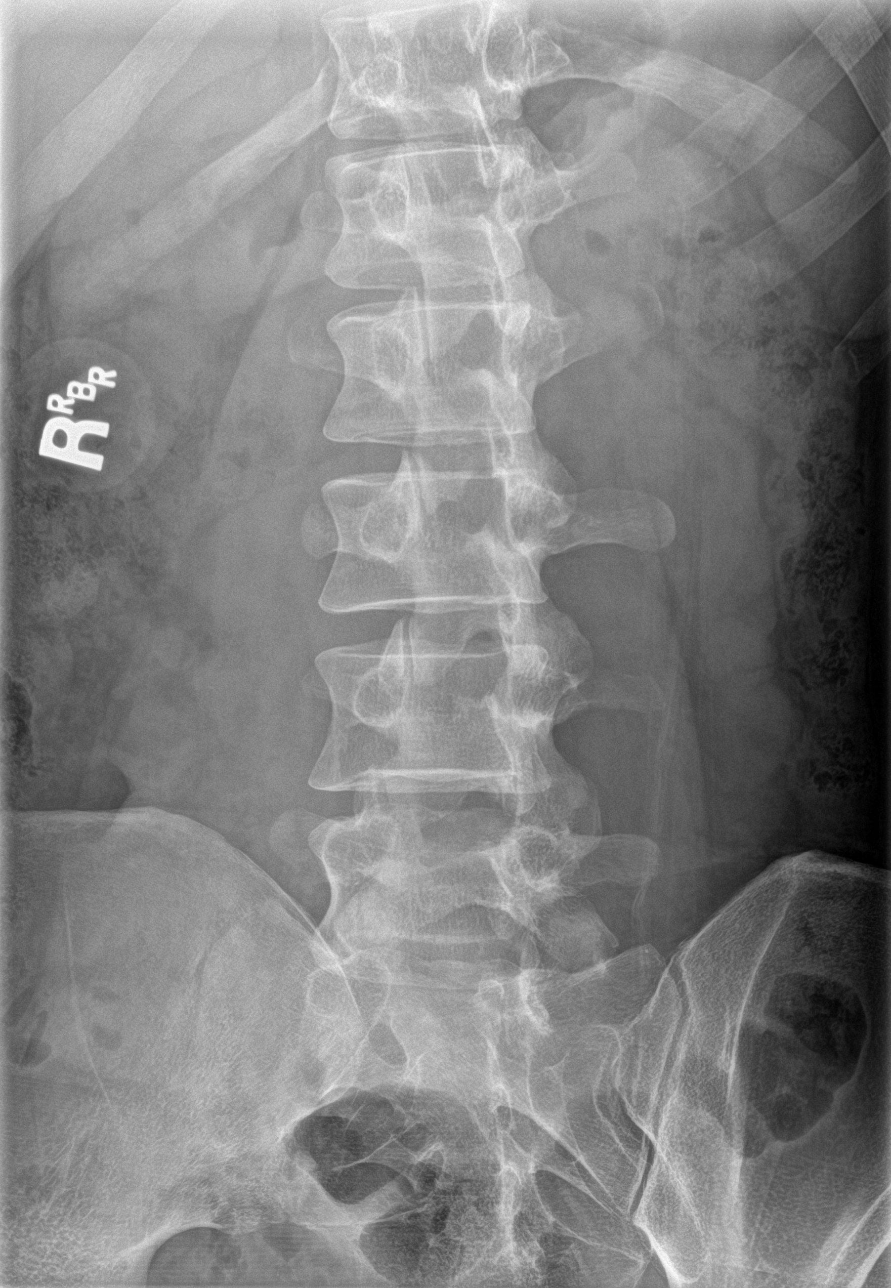

[l-spine obl (2 of 2)]
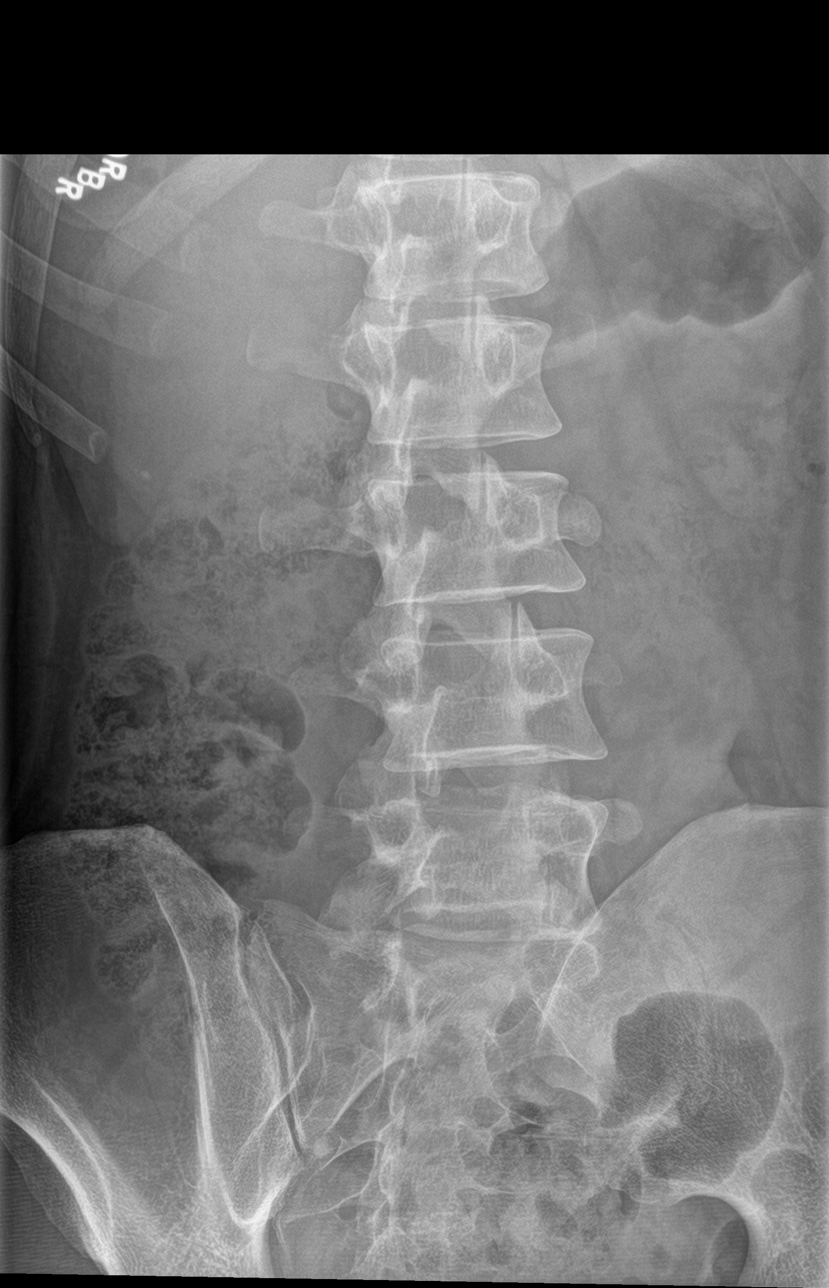

[l-spine lat]
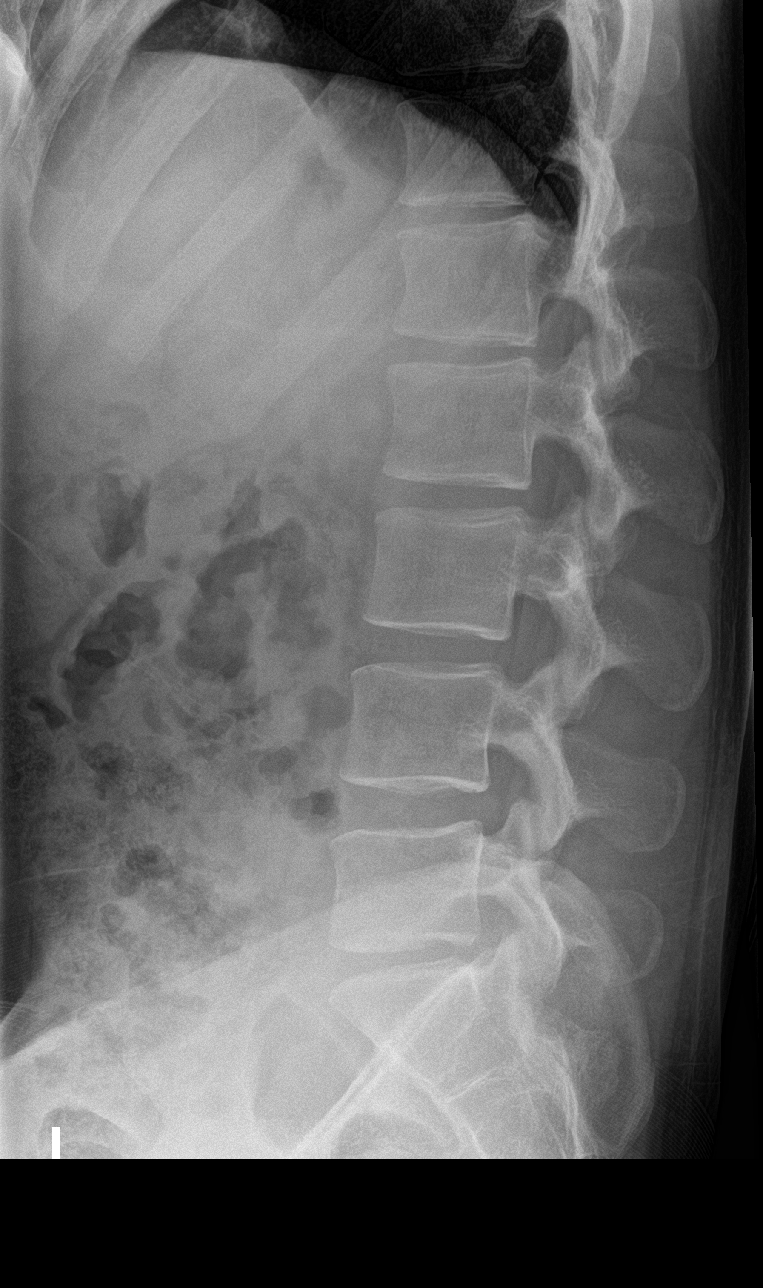

[l-spine spot]
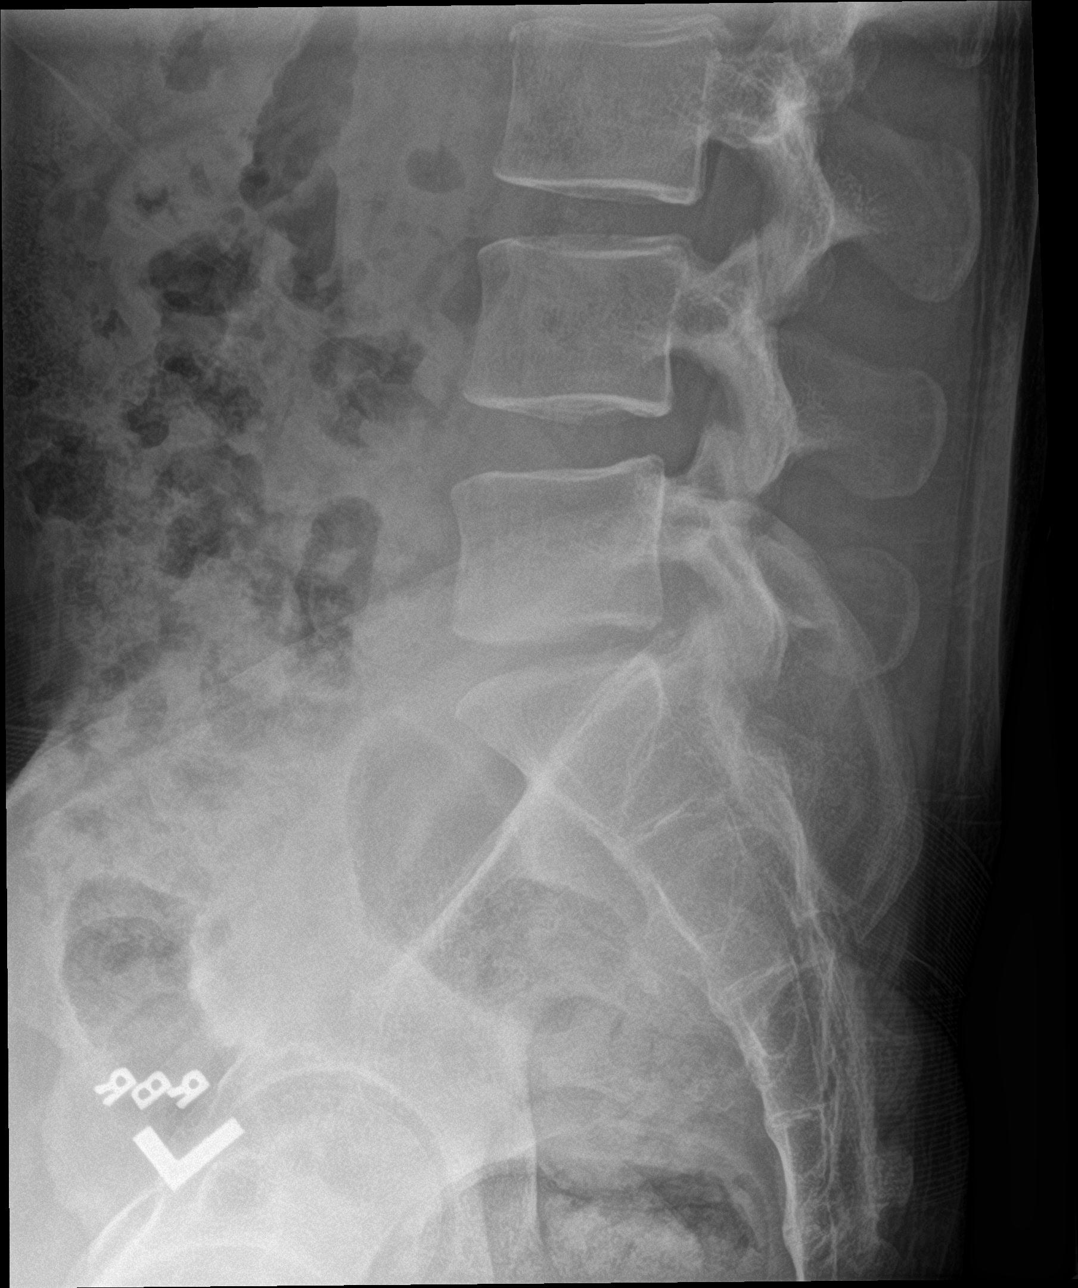

[5 of 5 positions shown; findings below may reference images not displayed]

FINDINGS: There is no evidence of lumbar spine fracture. Alignment is normal.
Intervertebral disc spaces are maintained.
IMPRESSION: Normal lumbar spine.

## 2019-12-30 ENCOUNTER — Telehealth: Payer: Self-pay

## 2019-12-30 NOTE — Telephone Encounter (Signed)
I am happy to see him.  Please have him schedule an appointment with me.

## 2019-12-30 NOTE — Telephone Encounter (Signed)
Patient scheduled for next week.

## 2019-12-30 NOTE — Telephone Encounter (Signed)
Patient called stated that he used to see you in [place] for his back and was being taken out of work. Stated you were doing the paperwork for that. Wanted to know if he needs to see you still for that or is he needing to see someone else

## 2020-01-07 ENCOUNTER — Ambulatory Visit: Payer: Self-pay | Admitting: Family Medicine

## 2020-01-07 NOTE — Progress Notes (Deleted)
   I, Christoper Fabian, LAT, ATC, am serving as scribe for Dr. Clementeen Graham.  Scott Greene is a 29 y.o. male who presents to [facility] today for f/u of low back pain and sciatica.   Pertinent review of systems: ***  Relevant historical information: ***   Exam:  There were no vitals taken for this visit. General: Well Developed, well nourished, and in no acute distress.   MSK: ***    Lab and Radiology Results No results found for this or any previous visit (from the past 72 hour(s)). No results found.     Assessment and Plan: 29 y.o. male with ***   PDMP not reviewed this encounter. No orders of the defined types were placed in this encounter.  No orders of the defined types were placed in this encounter.    Discussed warning signs or symptoms. Please see discharge instructions. Patient expresses understanding.   ***

## 2022-05-16 NOTE — Progress Notes (Deleted)
   I, Lekeith Wulf, LAT, ATC acting as a scribe for Evan Corey, MD.  Subjective:    CC: Back pain  HPI: Patient is a [age]-year-old male presenting with low back pain.  Patient was previously seen by Dr. Corey in 2018-20 for lumbosacral spondylolysis, sacroiliitis, and DDD of the L-spine.  Patient's last lumbar facet injections were on 05/15/2017. Today, patient reports low back pain?  Patient locates pain to?  Radiating pain: LE numbness/tingling: LE weakness: Aggravates: Treatments tried: prior facet injections  Dx imaging: 04/07/17 L-spine XR  Pertinent review of Systems: ***  Relevant historical information: ***   Objective:   There were no vitals filed for this visit. General: Well Developed, well nourished, and in no acute distress.   MSK: ***  Lab and Radiology Results No results found for this or any previous visit (from the past 72 hour(s)). No results found.    Impression and Recommendations:    Assessment and Plan: 31 y.o. male with ***.  PDMP not reviewed this encounter. No orders of the defined types were placed in this encounter.  No orders of the defined types were placed in this encounter.   Discussed warning signs or symptoms. Please see discharge instructions. Patient expresses understanding.   *** 

## 2022-05-21 ENCOUNTER — Ambulatory Visit: Payer: No Typology Code available for payment source | Admitting: Family Medicine

## 2022-05-31 ENCOUNTER — Ambulatory Visit: Payer: No Typology Code available for payment source | Admitting: Family Medicine

## 2022-06-07 ENCOUNTER — Ambulatory Visit: Payer: No Typology Code available for payment source | Admitting: Family Medicine

## 2022-06-21 ENCOUNTER — Ambulatory Visit: Payer: No Typology Code available for payment source | Admitting: Family Medicine

## 2022-06-21 NOTE — Progress Notes (Deleted)
   I, Peterson Lombard, LAT, ATC acting as a scribe for [name], MD.  Subjective:    CC: Back pain  HPI: Patient is a [age] year old male presenting with low back pain.  Patient was previously seen by Dr. [name] in 2018-20 for lumbosacral spondylolysis, sacroiliitis, and DDD of the L-spine.  Patient's last lumbar facet injections were on 05/15/2017. Today, patient reports low back pain?  Patient locates pain to?  Radiating pain: LE numbness/tingling: LE weakness: Aggravates: Treatments tried: prior facet injections  Dx imaging: 04/07/17 L-spine XR  Pertinent review of Systems: ***  Relevant historical information: ***   Objective:   There were no vitals filed for this visit. General: Well Developed, well nourished, and in no acute distress.   MSK: ***  Lab and Radiology Results No results found for this or any previous visit (from the past 72 hour(s)). No results found.    Impression and Recommendations:    Assessment and Plan: 32 y.o. male with ***.  PDMP not reviewed this encounter. No orders of the defined types were placed in this encounter.  No orders of the defined types were placed in this encounter.   Discussed warning signs or symptoms. Please see discharge instructions. Patient expresses understanding.   ***

## 2022-06-27 NOTE — Progress Notes (Signed)
I,Scott Greene,acting as a Education administrator for Scott Leader, MD.,have documented all relevant documentation on the behalf of Scott Leader, MD,as directed by  Scott Leader, MD while in the presence of Scott Leader, MD.  Subjective:    CC: Back pain  HPI: Patient is a [age] year old male presenting with low back pain.  Patient was previously seen by Dr. Georgina Greene in 2018-20 for lumbosacral spondylolysis, sacroiliitis, and DDD of the L-spine.  Patient's last lumbar facet injections were on 05/15/2017.   Today, patient reports low back pain x 4 years, worsening x 2 months since no longer seeing pain management.   Patient locates pain to right side lower back.  Radiating pain: into the R leg. Denies pain in gluteal region or groin.  LE numbness/tingling: denies LE weakness:  denies Aggravates: has 2 young children at home, ages 26 and 40.  Treatments tried: prior facet injections, pain management (no longer seeing), TENS unit, rest, Tylenol/IBU with minimal relief.   Dx imaging: 04/07/17 L-spine XR  Pertinent review of Systems: No fevers or chills  Relevant historical information: Lumbar radiculopathy and chronic back pain.   Objective:    Vitals:   06/28/22 1036  BP: 122/76  Pulse: 64  SpO2: 100%   General: Well Developed, well nourished, and in no acute distress.   MSK: L-spine nontender to palpation spinal midline.  Decreased lumbar motion. Lower extremity strength is intact.  Lab and Radiology Results No results found for this or any previous visit (from the past 72 hour(s)). DG Lumbar Spine 2-3 Views  Result Date: 06/28/2022 CLINICAL DATA:  Chronic low back pain EXAM: LUMBAR SPINE - 2-3 VIEW COMPARISON:  04/07/2017 FINDINGS: Normal alignment and pedicles. Preserved vertebral body heights. Disc space narrowing at L5-S1, slightly worse compared to the prior study. Normal appearing facets. No acute osseous finding or fracture. No focal kyphosis. Normal SI joints. IMPRESSION: L5-S1 disc space  narrowing, slightly worse compared to the prior study. Electronically Signed   By: Scott Mages.  Greene M.D.   On: 06/28/2022 11:14    I, Scott Greene, personally (independently) visualized and performed the interpretation of the images attached in this note.  MRI Spine Lumbar WO IV Contrast  Impression  IMPRESSION:  1. Small right distal foraminal/far lateral disc protrusion at L4-L5 which abuts the exiting right L4 nerve. Correlate with radicular symptoms. 2. Small central disc protrusion at L5-S1 which abuts but does not significantly displace the descending S1 nerve roots. No significant stenosis demonstrated.  Electronically Signed by: Scott Greene Narrative  MRI lumbar spine:  INDICATION: Right-sided low back pain  TECHNIQUE: Sagittal and axial T1 and T2-weighted sequences were performed. Additional sagittal STIR images were performed.  COMPARISON: CT abdomen pelvis from 10/18/2016.  FINDINGS:  Last well-formed disc space designated L5-S1 for the purposes of this report.  #  Vertebral bodies: No compression fracture. #  Alignment: No malalignment. Straightening of the normal lumbar lordosis. #  Marrow signal: Chronic appearing degenerative endplate change at 075-GRM. No significant bony edema. #  Conus medullaris: Normal. Terminates at L1 with no acute abnormality. #  Lower thoracic segments: No significant abnormality. #  No acute paraspinal soft tissue abnormality.  #  L1-2: No significant stenosis #  L2-3: No significant stenosis #  L3-4: No significant stenosis #  L4-5: Small disc bulge. Mild facet arthropathy. Small superimposed right distal foraminal/far lateral disc protrusion which abuts the exiting right L4 nerve. No significant stenosis. #  L5-S1: Mild facet arthropathy. Small central disc protrusion which  abuts but does not significantly displace the descending S1 nerve roots. No significant stenosis.   Impression and Recommendations:    Assessment and Plan: 32 y.o.  male with chronic right low back pain with right lumbar radiculopathy in an L5 dermatomal pattern.  Plan for epidural steroid injection and nerve root block targeting right L5.  This will be done at [place].  Additionally refer to [facility] to establish primary care and for pain management with opiate long-term.  Temporary refill of hydrocodone.  I cannot prescribe this medication a long-term matter.  If he will need long-term repeat L-spine injections or even potential spinal stimulators consider referral to Dr. Davy Greene.  He is also willing to consider surgery at this point.  Consider repeat lumbar spine MRI in the near future.  Last MRI is from 2018.   PDMP reviewed during this encounter. Orders Placed This Encounter  Procedures   DG Lumbar Spine 2-3 Views    Standing Status:   Future    Number of Occurrences:   1    Standing Expiration Date:   06/29/2023    Order Specific Question:   Reason for Exam (SYMPTOM  OR DIAGNOSIS REQUIRED)    Answer:   eval lumbar rad    Order Specific Question:   Preferred imaging location?    Answer:   [facility]   DG INJECT DIAG/THERA/INC NEEDLE/CATH/PLC EPI/LUMB/SAC W/IMG    Standing Status:   Future    Standing Expiration Date:   06/29/2023    Order Specific Question:   Reason for Exam (SYMPTOM  OR DIAGNOSIS REQUIRED)    Answer:   ESI/NRB Rt L5 symptoms. Level and technique per radiology    Order Specific Question:   Preferred Imaging Location?    Answer:   [id number]    Order Specific Question:   Radiology Contrast Protocol - do NOT remove file path    Answer:   [email or web address]   Ambulatory referral to Pain Clinic    Referral Priority:   Routine    Referral Type:   Consultation    Referral Reason:   Specialty Services Required    Requested Specialty:   Pain Medicine    Number of Visits Requested:   1   Meds ordered this encounter  Medications   HYDROcodone-acetaminophen  (NORCO/VICODIN) 5-325 MG tablet    Sig: Take 1 tablet by mouth every 6 (six) hours as needed.    Dispense:  15 tablet    Refill:  0    Discussed warning signs or symptoms. Please see discharge instructions. Patient expresses understanding.   The above documentation has been reviewed and is accurate and complete Scott Greene, M.D.

## 2022-06-28 ENCOUNTER — Ambulatory Visit (INDEPENDENT_AMBULATORY_CARE_PROVIDER_SITE_OTHER): Payer: No Typology Code available for payment source | Admitting: Family Medicine

## 2022-06-28 ENCOUNTER — Ambulatory Visit (INDEPENDENT_AMBULATORY_CARE_PROVIDER_SITE_OTHER): Payer: No Typology Code available for payment source

## 2022-06-28 ENCOUNTER — Encounter: Payer: Self-pay | Admitting: Family Medicine

## 2022-06-28 VITALS — BP 122/76 | HR 64 | Ht 74.0 in | Wt 183.2 lb

## 2022-06-28 DIAGNOSIS — G8929 Other chronic pain: Secondary | ICD-10-CM

## 2022-06-28 DIAGNOSIS — M5416 Radiculopathy, lumbar region: Secondary | ICD-10-CM

## 2022-06-28 DIAGNOSIS — M5441 Lumbago with sciatica, right side: Secondary | ICD-10-CM | POA: Diagnosis not present

## 2022-06-28 MED ORDER — HYDROCODONE-ACETAMINOPHEN 5-325 MG PO TABS: 1.0000 | ORAL_TABLET | Freq: Four times a day (QID) | ORAL | 0 refills | Status: AC | PRN

## 2022-06-28 NOTE — Patient Instructions (Signed)
Thank you for coming in today.   Please get an Xray today before you leave   Please call [facility] at [phone] to schedule your spine injection.    Please contact [facility] to get established for primary care and pain management.   I cannot prescribe you long term medicine for pain control.   If you need multiple injections Dr [name] may be a good choice.

## 2022-07-01 NOTE — Progress Notes (Signed)
Lumbar spine x-ray shows a little bit of arthritis changes at L5-S1.  MRI in the future will be helpful if needed.
